# Patient Record
Sex: Male | Born: 1935 | Race: Black or African American | Hispanic: No | Marital: Married | Smoking: Former smoker
Health system: Southern US, Community
[De-identification: ages and names within clinical notes are randomized; demographics above are authoritative.]

## PROBLEM LIST (undated history)

## (undated) DIAGNOSIS — K219 Gastro-esophageal reflux disease without esophagitis: Secondary | ICD-10-CM

## (undated) DIAGNOSIS — E785 Hyperlipidemia, unspecified: Secondary | ICD-10-CM

## (undated) DIAGNOSIS — C8310 Mantle cell lymphoma, unspecified site: Secondary | ICD-10-CM

## (undated) DIAGNOSIS — I10 Essential (primary) hypertension: Secondary | ICD-10-CM

## (undated) HISTORY — PX: TONSILLECTOMY: SUR1361

## (undated) HISTORY — PX: CARPAL TUNNEL RELEASE: SHX101

---

## 2010-05-15 ENCOUNTER — Emergency Department (HOSPITAL_BASED_OUTPATIENT_CLINIC_OR_DEPARTMENT_OTHER)
Admission: EM | Admit: 2010-05-15 | Discharge: 2010-05-15 | Payer: Self-pay | Source: Home / Self Care | Admitting: Emergency Medicine

## 2010-05-15 ENCOUNTER — Ambulatory Visit: Payer: Self-pay | Admitting: Diagnostic Radiology

## 2010-09-06 LAB — POCT CARDIAC MARKERS
CKMB, poc: 1.6 ng/mL (ref 1.0–8.0)
Myoglobin, poc: 63.3 ng/mL (ref 12–200)

## 2010-09-06 LAB — URINALYSIS, ROUTINE W REFLEX MICROSCOPIC
Bilirubin Urine: NEGATIVE
Glucose, UA: NEGATIVE mg/dL
Hgb urine dipstick: NEGATIVE
Protein, ur: NEGATIVE mg/dL
Urobilinogen, UA: 1 mg/dL (ref 0.0–1.0)

## 2010-09-06 LAB — CBC
Hemoglobin: 11.4 g/dL — ABNORMAL LOW (ref 13.0–17.0)
MCH: 24.2 pg — ABNORMAL LOW (ref 26.0–34.0)
Platelets: 153 10*3/uL (ref 150–400)
RBC: 4.7 MIL/uL (ref 4.22–5.81)
WBC: 9.1 10*3/uL (ref 4.0–10.5)

## 2010-09-06 LAB — DIFFERENTIAL
Eosinophils Absolute: 0.3 10*3/uL (ref 0.0–0.7)
Lymphocytes Relative: 20 % (ref 12–46)
Lymphs Abs: 1.8 10*3/uL (ref 0.7–4.0)
Monocytes Relative: 8 % (ref 3–12)
Neutro Abs: 6.3 10*3/uL (ref 1.7–7.7)
Neutrophils Relative %: 69 % (ref 43–77)

## 2010-09-06 LAB — BASIC METABOLIC PANEL
CO2: 25 mEq/L (ref 19–32)
Calcium: 9.2 mg/dL (ref 8.4–10.5)
Creatinine, Ser: 1.1 mg/dL (ref 0.4–1.5)
GFR calc Af Amer: >60 mL/min (ref >60)
GFR calc non Af Amer: >60 mL/min (ref >60)
Sodium: 142 mEq/L (ref 135–145)

## 2010-09-06 LAB — PROTIME-INR
INR: 0.97 (ref 0.00–1.49)
Prothrombin Time: 13.1 seconds (ref 11.6–15.2)

## 2010-09-06 LAB — APTT: aPTT: 30 seconds (ref 24–37)

## 2015-04-27 ENCOUNTER — Inpatient Hospital Stay (HOSPITAL_COMMUNITY): Payer: Medicare Other

## 2015-04-27 ENCOUNTER — Emergency Department (HOSPITAL_BASED_OUTPATIENT_CLINIC_OR_DEPARTMENT_OTHER): Payer: Medicare Other

## 2015-04-27 ENCOUNTER — Inpatient Hospital Stay (HOSPITAL_BASED_OUTPATIENT_CLINIC_OR_DEPARTMENT_OTHER)
Admission: EM | Admit: 2015-04-27 | Discharge: 2015-04-30 | DRG: 178 | Disposition: A | Discharge status: Skilled Nursing Facility | Payer: Medicare Other | Attending: Internal Medicine | Admitting: Internal Medicine

## 2015-04-27 ENCOUNTER — Encounter (HOSPITAL_BASED_OUTPATIENT_CLINIC_OR_DEPARTMENT_OTHER): Payer: Self-pay

## 2015-04-27 DIAGNOSIS — R531 Weakness: Secondary | ICD-10-CM

## 2015-04-27 DIAGNOSIS — R0602 Shortness of breath: Secondary | ICD-10-CM | POA: Diagnosis present

## 2015-04-27 DIAGNOSIS — J189 Pneumonia, unspecified organism: Secondary | ICD-10-CM | POA: Diagnosis present

## 2015-04-27 DIAGNOSIS — E440 Moderate protein-calorie malnutrition: Secondary | ICD-10-CM | POA: Diagnosis present

## 2015-04-27 DIAGNOSIS — H409 Unspecified glaucoma: Secondary | ICD-10-CM | POA: Diagnosis present

## 2015-04-27 DIAGNOSIS — E785 Hyperlipidemia, unspecified: Secondary | ICD-10-CM | POA: Diagnosis present

## 2015-04-27 DIAGNOSIS — Z87891 Personal history of nicotine dependence: Secondary | ICD-10-CM

## 2015-04-27 DIAGNOSIS — L8990 Pressure ulcer of unspecified site, unspecified stage: Secondary | ICD-10-CM | POA: Diagnosis present

## 2015-04-27 DIAGNOSIS — H5442 Blindness, left eye, normal vision right eye: Secondary | ICD-10-CM | POA: Diagnosis present

## 2015-04-27 DIAGNOSIS — Z7982 Long term (current) use of aspirin: Secondary | ICD-10-CM

## 2015-04-27 DIAGNOSIS — J690 Pneumonitis due to inhalation of food and vomit: Principal | ICD-10-CM | POA: Diagnosis present

## 2015-04-27 DIAGNOSIS — Z823 Family history of stroke: Secondary | ICD-10-CM | POA: Diagnosis not present

## 2015-04-27 DIAGNOSIS — K219 Gastro-esophageal reflux disease without esophagitis: Secondary | ICD-10-CM | POA: Diagnosis present

## 2015-04-27 DIAGNOSIS — Z801 Family history of malignant neoplasm of trachea, bronchus and lung: Secondary | ICD-10-CM | POA: Diagnosis not present

## 2015-04-27 DIAGNOSIS — R05 Cough: Secondary | ICD-10-CM | POA: Diagnosis not present

## 2015-04-27 DIAGNOSIS — R197 Diarrhea, unspecified: Secondary | ICD-10-CM | POA: Diagnosis present

## 2015-04-27 DIAGNOSIS — R29898 Other symptoms and signs involving the musculoskeletal system: Secondary | ICD-10-CM | POA: Diagnosis not present

## 2015-04-27 DIAGNOSIS — Z8042 Family history of malignant neoplasm of prostate: Secondary | ICD-10-CM

## 2015-04-27 DIAGNOSIS — R4781 Slurred speech: Secondary | ICD-10-CM | POA: Diagnosis present

## 2015-04-27 DIAGNOSIS — C8310 Mantle cell lymphoma, unspecified site: Secondary | ICD-10-CM | POA: Diagnosis present

## 2015-04-27 DIAGNOSIS — I10 Essential (primary) hypertension: Secondary | ICD-10-CM | POA: Diagnosis present

## 2015-04-27 DIAGNOSIS — R059 Cough, unspecified: Secondary | ICD-10-CM | POA: Diagnosis present

## 2015-04-27 DIAGNOSIS — I6789 Other cerebrovascular disease: Secondary | ICD-10-CM | POA: Diagnosis not present

## 2015-04-27 DIAGNOSIS — C8590 Non-Hodgkin lymphoma, unspecified, unspecified site: Secondary | ICD-10-CM

## 2015-04-27 HISTORY — DX: Gastro-esophageal reflux disease without esophagitis: K21.9

## 2015-04-27 HISTORY — DX: Essential (primary) hypertension: I10

## 2015-04-27 HISTORY — DX: Hyperlipidemia, unspecified: E78.5

## 2015-04-27 HISTORY — DX: Mantle cell lymphoma, unspecified site: C83.10

## 2015-04-27 LAB — COMPREHENSIVE METABOLIC PANEL
ALT: 25 U/L (ref 17–63)
AST: 40 U/L (ref 15–41)
Albumin: 2.4 g/dL — ABNORMAL LOW (ref 3.5–5.0)
Alkaline Phosphatase: 90 U/L (ref 38–126)
Anion gap: 7 (ref 5–15)
BILIRUBIN TOTAL: 0.6 mg/dL (ref 0.3–1.2)
BUN: 19 mg/dL (ref 6–20)
CHLORIDE: 110 mmol/L (ref 101–111)
CO2: 24 mmol/L (ref 22–32)
CREATININE: 0.9 mg/dL (ref 0.61–1.24)
Calcium: 8.7 mg/dL — ABNORMAL LOW (ref 8.9–10.3)
Glucose, Bld: 103 mg/dL — ABNORMAL HIGH (ref 65–99)
POTASSIUM: 3.8 mmol/L (ref 3.5–5.1)
Sodium: 141 mmol/L (ref 135–145)
TOTAL PROTEIN: 6 g/dL — AB (ref 6.5–8.1)

## 2015-04-27 LAB — CBC
HEMATOCRIT: 33 % — AB (ref 39.0–52.0)
Hemoglobin: 10.6 g/dL — ABNORMAL LOW (ref 13.0–17.0)
MCH: 22.1 pg — AB (ref 26.0–34.0)
MCHC: 32.1 g/dL (ref 30.0–36.0)
MCV: 68.9 fL — AB (ref 78.0–100.0)
PLATELETS: 246 10*3/uL (ref 150–400)
RBC: 4.79 MIL/uL (ref 4.22–5.81)
RDW: 18.3 % — AB (ref 11.5–15.5)
WBC: 67.2 10*3/uL (ref 4.0–10.5)

## 2015-04-27 LAB — APTT: aPTT: 31 seconds (ref 24–37)

## 2015-04-27 LAB — PROTIME-INR
INR: 1.23 (ref 0.00–1.49)
PROTHROMBIN TIME: 15.7 s — AB (ref 11.6–15.2)

## 2015-04-27 LAB — TROPONIN I

## 2015-04-27 MED ORDER — AZITHROMYCIN 500 MG IV SOLR
INTRAVENOUS | Status: AC
  Filled 2015-04-27: qty 500

## 2015-04-27 MED ORDER — SODIUM CHLORIDE 0.9 % IV BOLUS (SEPSIS): 1000.0000 mL | Freq: Once | INTRAVENOUS | Status: DC

## 2015-04-27 MED ORDER — STROKE: EARLY STAGES OF RECOVERY BOOK
Freq: Once | Status: AC
  Administered 2015-04-27: 20:00:00
  Filled 2015-04-27: qty 1

## 2015-04-27 MED ORDER — HYDRALAZINE HCL 25 MG PO TABS
25.0000 mg | ORAL_TABLET | Freq: Three times a day (TID) | ORAL | Status: DC
  Administered 2015-04-27 – 2015-04-28 (×3): 25 mg via ORAL
  Filled 2015-04-27 (×4): qty 1

## 2015-04-27 MED ORDER — SENNOSIDES-DOCUSATE SODIUM 8.6-50 MG PO TABS: 1.0000 | ORAL_TABLET | Freq: Every evening | ORAL | Status: DC | PRN

## 2015-04-27 MED ORDER — HEPARIN SODIUM (PORCINE) 5000 UNIT/ML IJ SOLN
5000.0000 [IU] | Freq: Three times a day (TID) | INTRAMUSCULAR | Status: DC
  Administered 2015-04-27 – 2015-04-30 (×9): 5000 [IU] via SUBCUTANEOUS
  Filled 2015-04-27 (×8): qty 1

## 2015-04-27 MED ORDER — PANTOPRAZOLE SODIUM 40 MG PO TBEC
40.0000 mg | DELAYED_RELEASE_TABLET | Freq: Every day | ORAL | Status: DC
  Administered 2015-04-27 – 2015-04-30 (×4): 40 mg via ORAL
  Filled 2015-04-27 (×4): qty 1

## 2015-04-27 MED ORDER — ATENOLOL 50 MG PO TABS
50.0000 mg | ORAL_TABLET | Freq: Every day | ORAL | Status: DC
  Administered 2015-04-27 – 2015-04-30 (×4): 50 mg via ORAL
  Filled 2015-04-27 (×5): qty 1

## 2015-04-27 MED ORDER — PREDNISOLONE ACETATE 0.12 % OP SUSP: 1.0000 [drp] | Freq: Four times a day (QID) | OPHTHALMIC | Status: DC

## 2015-04-27 MED ORDER — EZETIMIBE 10 MG PO TABS
10.0000 mg | ORAL_TABLET | Freq: Every day | ORAL | Status: DC
  Administered 2015-04-27 – 2015-04-30 (×4): 10 mg via ORAL
  Filled 2015-04-27 (×4): qty 1

## 2015-04-27 MED ORDER — ONDANSETRON HCL 4 MG/2ML IJ SOLN: 4.0000 mg | Freq: Three times a day (TID) | INTRAMUSCULAR | Status: DC | PRN

## 2015-04-27 MED ORDER — PIPERACILLIN-TAZOBACTAM 3.375 G IVPB
3.3750 g | Freq: Three times a day (TID) | INTRAVENOUS | Status: DC
  Administered 2015-04-27 – 2015-04-30 (×9): 3.375 g via INTRAVENOUS
  Filled 2015-04-27 (×12): qty 50

## 2015-04-27 MED ORDER — SODIUM CHLORIDE 0.9 % IV SOLN
INTRAVENOUS | Status: DC
  Administered 2015-04-27: 17:00:00 via INTRAVENOUS

## 2015-04-27 MED ORDER — ALBUTEROL SULFATE (2.5 MG/3ML) 0.083% IN NEBU: 2.5000 mg | INHALATION_SOLUTION | RESPIRATORY_TRACT | Status: DC | PRN

## 2015-04-27 MED ORDER — DEXTROSE 5 % IV SOLN
1.0000 g | Freq: Once | INTRAVENOUS | Status: AC
  Administered 2015-04-27: 1 g via INTRAVENOUS

## 2015-04-27 MED ORDER — VANCOMYCIN HCL IN DEXTROSE 750-5 MG/150ML-% IV SOLN
750.0000 mg | Freq: Two times a day (BID) | INTRAVENOUS | Status: DC
  Administered 2015-04-27 – 2015-04-30 (×6): 750 mg via INTRAVENOUS
  Filled 2015-04-27 (×7): qty 150

## 2015-04-27 MED ORDER — CEFTRIAXONE SODIUM 1 G IJ SOLR
INTRAMUSCULAR | Status: AC
  Filled 2015-04-27: qty 10

## 2015-04-27 MED ORDER — ENSURE ENLIVE PO LIQD
237.0000 mL | Freq: Two times a day (BID) | ORAL | Status: DC
  Administered 2015-04-28 – 2015-04-30 (×5): 237 mL via ORAL

## 2015-04-27 MED ORDER — ATROPINE SULFATE 1 % OP SOLN
1.0000 [drp] | Freq: Three times a day (TID) | OPHTHALMIC | Status: DC
  Administered 2015-04-27 – 2015-04-28 (×2): 1 [drp] via OPHTHALMIC
  Filled 2015-04-27: qty 2

## 2015-04-27 MED ORDER — TERAZOSIN HCL 5 MG PO CAPS
10.0000 mg | ORAL_CAPSULE | Freq: Every day | ORAL | Status: DC
  Administered 2015-04-27 – 2015-04-29 (×2): 10 mg via ORAL
  Filled 2015-04-27 (×4): qty 2

## 2015-04-27 MED ORDER — DEXTROSE 5 % IV SOLN
500.0000 mg | Freq: Once | INTRAVENOUS | Status: AC
  Administered 2015-04-27: 500 mg via INTRAVENOUS

## 2015-04-27 MED ORDER — DM-GUAIFENESIN ER 30-600 MG PO TB12
1.0000 | ORAL_TABLET | Freq: Two times a day (BID) | ORAL | Status: DC
  Administered 2015-04-27 – 2015-04-30 (×6): 1 via ORAL
  Filled 2015-04-27 (×6): qty 1

## 2015-04-27 NOTE — Progress Notes (Signed)
ANTIBIOTIC CONSULT NOTE - INITIAL  Pharmacy Consult for Vancomycin and Zosyn Indication: pneumonia  No Known Allergies  Patient Measurements: Height: 5\' 11"  (180.3 cm) Weight: 160 lb (72.576 kg) IBW/kg (Calculated) : 75.3  Vital Signs: Temp: 98.2 F (36.8 C) (11/01 2045) Temp Source: Oral (11/01 2045) BP: 107/61 mmHg (11/01 2045) Pulse Rate: 77 (11/01 2045)  Labs:  Recent Labs  04/27/15 1420  WBC 67.2*  HGB 10.6*  PLT 246  CREATININE 0.90   Estimated Creatinine Clearance: 58.3 mL/min (by C-G formula based on Cr of 0.9).  Medical History: Past Medical History  Diagnosis Date  . Hypertension   . Mantle cell lymphoma ([place])   . GERD (gastroesophageal reflux disease)   . HLD (hyperlipidemia)    Assessment:   81 yr old male to begin Vanc and Zosyn for CAP vs aspiration pneumonia.  Received Azithromycin 500 mg IV and Ceftriaxone 1 gm IV at [facility] ~5pm.  Blood and sputum cultures sent. Urine to be sent for culture.  Diarrhea prior to admit, to send stool for C diff PCR.  Hx mantle cell lymphoma. WBC elevated.  Goal of Therapy:  Vancomycin trough level 15-20 mcg/ml appropriate Zosyn dose for renal function and infection  Plan:   Vancomycin 750 mg IV q12hrs.  Zosyn 3.375 gm IV q8hrs (each over 4 hrs).  Follow renal function, culture data, progress.  Vanc trough level at steady state.  Arty Baumgartner, [place]  Pager: [phone] 04/27/2015,9:12 PM

## 2015-04-27 NOTE — ED Provider Notes (Signed)
CSN: [id number]     Arrival date & time 04/27/15  1247 History   First MD Initiated Contact with Patient 04/27/15 1445     Chief Complaint  Patient presents with  . Weakness     (Consider location/radiation/quality/duration/timing/severity/associated sxs/prior Treatment) Patient is a 81 y.o. male presenting with general illness.  Illness Location:  Generalized Quality:  Weakness Severity:  Moderate Onset quality:  Gradual Duration:  2 weeks Timing:  Constant Progression:  Worsening Chronicity:  New Context:  With diarrhea Relieved by:  Nothing Worsened by:  Nothing Associated symptoms: diarrhea and fatigue   Associated symptoms: no abdominal pain, no chest pain, no cough, no fever, no nausea, no shortness of breath and no vomiting     Past Medical History  Diagnosis Date  . Hypertension   . Mantle cell lymphoma ([place])   . GERD (gastroesophageal reflux disease)   . HLD (hyperlipidemia)    Past Surgical History  Procedure Laterality Date  . Carpal tunnel release    . Tonsillectomy     Family History  Problem Relation Age of Onset  . Prostate cancer Father   . Lung cancer Brother   . Stroke Sister    Social History  Substance Use Topics  . Smoking status: Former Research scientist (life sciences)  . Smokeless tobacco: None  . Alcohol Use: Yes     Comment: occ    Review of Systems  Constitutional: Positive for fatigue. Negative for fever.  Respiratory: Negative for cough and shortness of breath.   Cardiovascular: Negative for chest pain.  Gastrointestinal: Positive for diarrhea. Negative for nausea, vomiting and abdominal pain.  All other systems reviewed and are negative.     Allergies  Review of patient's allergies indicates no known allergies.  Home Medications   Prior to Admission medications   Medication Sig Start Date End Date Taking? Authorizing Provider  aspirin EC 81 MG tablet Take 81 mg by mouth daily.   Yes Historical Provider, MD  atenolol (TENORMIN) 50 MG tablet  Take 50 mg by mouth 2 (two) times daily.    Yes Historical Provider, MD  atropine 1 % ophthalmic solution Place 1 drop into the left eye daily.    Yes Historical Provider, MD  brimonidine-timolol (COMBIGAN) 0.2-0.5 % ophthalmic solution Place 1 drop into the right eye 2 (two) times daily. 12/28/13  Yes Historical Provider, MD  CALCIUM PO Take 1 tablet by mouth daily.   Yes Historical Provider, MD  ezetimibe (ZETIA) 10 MG tablet Take 10 mg by mouth daily.   Yes Historical Provider, MD  hydrALAZINE (APRESOLINE) 25 MG tablet Take 25 mg by mouth 3 (three) times daily.   Yes Historical Provider, MD  Multiple Vitamin (MULTIVITAMIN WITH MINERALS) TABS tablet Take 1 tablet by mouth daily.   Yes Historical Provider, MD  omeprazole (PRILOSEC) 20 MG capsule Take 20 mg by mouth daily.   Yes Historical Provider, MD  prednisoLONE acetate (PRED FORTE) 1 % ophthalmic suspension Place 1 drop into the left eye daily. 04/06/15  Yes Historical Provider, MD  terazosin (HYTRIN) 10 MG capsule Take 10 mg by mouth at bedtime.   Yes Historical Provider, MD   BP 109/55 mmHg  Pulse 79  Temp(Src) 97.7 F (36.5 C) (Oral)  Resp 18  Ht 5\' 11"  (1.803 m)  Wt 160 lb (72.576 kg)  BMI 22.33 kg/m2  SpO2 94% Physical Exam  Constitutional: He is oriented to person, place, and time. He appears well-developed and well-nourished.  HENT:  Head: Normocephalic and atraumatic.  Eyes: Conjunctivae and EOM are normal.  Neck: Normal range of motion. Neck supple.  Cardiovascular: Normal rate, regular rhythm and normal heart sounds.   Pulmonary/Chest: Effort normal and breath sounds normal. No respiratory distress.  Abdominal: He exhibits no distension. There is no tenderness. There is no rebound and no guarding.  Musculoskeletal: Normal range of motion.  Neurological: He is alert and oriented to person, place, and time. He has normal strength. No cranial nerve deficit or sensory deficit. GCS eye subscore is 4. GCS verbal subscore is 5.  GCS motor subscore is 6.  Skin: Skin is warm and dry.  Vitals reviewed.   ED Course  Procedures (including critical care time) Labs Review Labs Reviewed  CBC - Abnormal; Notable for the following:    WBC 67.2 (*)    Hemoglobin 10.6 (*)    HCT 33.0 (*)    MCV 68.9 (*)    MCH 22.1 (*)    RDW 18.3 (*)    All other components within normal limits  COMPREHENSIVE METABOLIC PANEL - Abnormal; Notable for the following:    Glucose, Bld 103 (*)    Calcium 8.7 (*)    Total Protein 6.0 (*)    Albumin 2.4 (*)    All other components within normal limits  URINALYSIS, ROUTINE W REFLEX MICROSCOPIC (NOT AT [facility]) - Abnormal; Notable for the following:    Color, Urine AMBER (*)    Bilirubin Urine SMALL (*)    Protein, ur 30 (*)    All other components within normal limits  BRAIN NATRIURETIC PEPTIDE - Abnormal; Notable for the following:    B Natriuretic Peptide 243.3 (*)    All other components within normal limits  HEMOGLOBIN A1C - Abnormal; Notable for the following:    Hgb A1c MFr Bld 6.0 (*)    All other components within normal limits  LIPID PANEL - Abnormal; Notable for the following:    HDL 29 (*)    All other components within normal limits  PROTIME-INR - Abnormal; Notable for the following:    Prothrombin Time 15.7 (*)    All other components within normal limits  URINE MICROSCOPIC-ADD ON - Abnormal; Notable for the following:    Crystals CA OXALATE CRYSTALS (*)    All other components within normal limits  CBC - Abnormal; Notable for the following:    Hemoglobin 10.1 (*)    HCT 32.0 (*)    MCV 71.6 (*)    MCH 22.6 (*)    RDW 18.5 (*)    All other components within normal limits  BASIC METABOLIC PANEL - Abnormal; Notable for the following:    Potassium 3.2 (*)    BUN 21 (*)    Calcium 8.3 (*)    GFR calc non Af Amer 54 (*)    All other components within normal limits  URINE CULTURE  CULTURE, BLOOD (ROUTINE X 2)  CULTURE, BLOOD (ROUTINE X 2)  C DIFFICILE QUICK SCREEN W  PCR REFLEX  CULTURE, EXPECTORATED SPUTUM-ASSESSMENT  STOOL CULTURE  CULTURE, EXPECTORATED SPUTUM-ASSESSMENT  TROPONIN I  INFLUENZA PANEL BY PCR (TYPE A & B, H1N1)  APTT  STREP PNEUMONIAE URINARY ANTIGEN  LEGIONELLA PNEUMOPHILA SEROGP 1 UR AG    Imaging Review Dg Chest 2 View  04/27/2015  CLINICAL DATA:  Cough, bilateral lower extremity edema, weakness. Symptoms for a few days. EXAM: CHEST  2 VIEW COMPARISON:  10/13/2014 FINDINGS: Small to moderate right pleural effusion with right lower atelectasis or infiltrate. No confluent opacity  on the left. Heart is normal size. No acute bony abnormality. IMPRESSION: Small to moderate right pleural effusion with right lower lobe atelectasis or infiltrate. Electronically Signed   By: Rolm Baptise M.D.   On: 04/27/2015 15:32   Ct Head Wo Contrast  04/27/2015  CLINICAL DATA:  Left-sided weakness today. EXAM: CT HEAD WITHOUT CONTRAST TECHNIQUE: Contiguous axial images were obtained from the base of the skull through the vertex without intravenous contrast. COMPARISON:  10/13/2014 FINDINGS: Mild cerebral atrophy. Patchy low-attenuation changes in the deep white matter consistent with small vessel ischemia. No ventricular dilatation. No mass effect or midline shift. No abnormal extra-axial fluid collections. Gray-white matter junctions are distinct. Basal cisterns are not effaced. No evidence of acute intracranial hemorrhage. No depressed skull fractures. Visualized paranasal sinuses and mastoid air cells are not opacified. Postoperative changes in the orbits. Vascular calcifications. IMPRESSION: No acute intracranial abnormalities. Mild chronic atrophy and small vessel ischemic changes. Electronically Signed   By: Lucienne Capers M.D.   On: 04/27/2015 21:54   Ct Chest W Contrast  04/28/2015  CLINICAL DATA:  81 year old male inpatient with a reported history of mantle cell lymphoma. Restaging. EXAM: CT CHEST, ABDOMEN, AND PELVIS WITH CONTRAST TECHNIQUE:  Multidetector CT imaging of the chest, abdomen and pelvis was performed following the standard protocol during bolus administration of intravenous contrast. CONTRAST:  160mL OMNIPAQUE IOHEXOL 300 MG/ML  SOLN COMPARISON:  05/29/2014 PET-CT.  09/27/2011 CT abdomen/pelvis. FINDINGS: CT CHEST FINDINGS Mediastinum/Nodes: Normal heart size. No pericardial fluid/thickening. There is atherosclerosis of the thoracic aorta, the great vessels of the mediastinum and the coronary arteries, including calcified atherosclerotic plaque in the left anterior descending, left circumflex and right coronary arteries. Great vessels are normal in course and caliber. No central pulmonary emboli. Normal visualized thyroid. Mildly patulous and otherwise grossly normal esophagus. There is moderate bilateral axillary lymphadenopathy, with a dominant 2.0 cm enlarged right axillary node (series 201/ image 22), increased from 1.1 cm on 05/29/2014. An enlarged 1.7 cm left axillary node (201/ image 21) is increased from 0.9 cm. Anterior mediastinal 1.1 cm mass (201/20) is increased from 0.8 cm, either thymic hyperplasia or anterior mediastinal lymphadenopathy. Moderate mediastinal lymphadenopathy in the paratracheal and subcarinal territories has increased. For example an enlarged 1.6 cm right paratracheal node (201/15) is increased from 0.6 cm. A 1.9 cm subcarinal node (201/31) is increased from 0.7 cm. Mild-to-moderate bilateral hilar lymphadenopathy is increased bilaterally. Coarsely calcified nodes from prior granulomatous disease are again noted in the left hilum. Lungs/Pleura: No pneumothorax. New moderate right pleural effusion with mild posterior right pleural thickening up to 7 mm thickness. No left pleural effusion. There is new peribronchovascular interstitial thickening and nodularity in the right greater than left lungs. A subpleural 1.0 cm anterior left lower lobe pulmonary nodule (203/35) is increased from 0.6 cm. There is moderate  passive atelectasis in the dependent right lung. Musculoskeletal: No aggressive appearing focal osseous lesions. Severe right glenohumeral joint osteoarthritis. Moderate degenerative changes in the thoracic spine. There is anasarca. CT ABDOMEN PELVIS FINDINGS Hepatobiliary: Normal liver with no liver mass. Normal gallbladder with no radiopaque cholelithiasis. No biliary ductal dilatation. Pancreas: Normal, with no mass or duct dilation. Spleen: Normal size. No mass. Adrenals/Urinary Tract: Normal adrenals. Three subcentimeter hypodense lesions in the right kidney are too small to characterize. No hydronephrosis. Normal bladder. Stomach/Bowel: Grossly normal stomach. Normal caliber small bowel with no small bowel wall thickening. Appendix is not discretely visualized. There is diffuse wall thickening of the entire colon and rectum.  Vascular/Lymphatic: Atherosclerotic nonaneurysmal abdominal aorta. Patent portal, splenic, hepatic and renal veins. There is new bulky mesenteric lymphadenopathy, for example a 3.4 cm central mesenteric node (201/79) . There is new bilateral mild inguinal lymphadenopathy. Reproductive: Stable moderate prostatomegaly. Other: No pneumoperitoneum, ascites or focal fluid collection. Musculoskeletal: No aggressive appearing focal osseous lesions. Stable Paget's disease of the right proximal femur. Severe degenerative changes in the lumbar spine. Anasarca. IMPRESSION: 1. Interval progression of bilateral axillary, mediastinal, bilateral hilar, mesenteric and bilateral inguinal lymphadenopathy, in keeping with progressive lymphoma. 2. New peribronchovascular interstitial thickening and nodularity involving the right greater than left lungs. Enlarging left lower lobe subpleural pulmonary nodule. 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 3. New moderate right pleural effusion with mild pleural thickening, suggesting a malignant effusion. 4. Anasarca. Diffuse colorectal wall  thickening, likely due to non inflammatory edema such as due to hypoalbuminemia given the anasarca. 5. Atherosclerosis, including three-vessel coronary artery disease. Please note that although the presence of coronary artery calcium documents the presence of coronary artery disease, the severity of this disease and any potential stenosis cannot be assessed on this non-gated CT examination. Electronically Signed   By: Ilona Sorrel M.D.   On: 04/28/2015 18:56   Mr Brain Wo Contrast  04/27/2015  CLINICAL DATA:  Generalized weakness, diarrhea, slurred speech, RIGHT leg weakness and bilateral leg edema beginning 5 days ago. History of lymphoma, hypertension, hyperlipidemia, LEFT eye blindness. EXAM: MRI HEAD WITHOUT CONTRAST MRA HEAD WITHOUT CONTRAST TECHNIQUE: Multiplanar, multiecho pulse sequences of the brain and surrounding structures were obtained without intravenous contrast. Angiographic images of the head were obtained using MRA technique without contrast. COMPARISON:  CT head April 27, 2015 at 2132 hours FINDINGS: MRI HEAD FINDINGS Punctate focus of reduced diffusion LEFT pons on axial 13/94 not corroborated on coronal T2 view likely artifact. Ventricles and sulci are normal for patient's age. No susceptibility artifact to suggest blood products. Patchy to confluent supratentorial white matter FLAIR T2 hyperintense signal without midline shift, mass effect or mass lesions. Old small RIGHT basal ganglia lacunar infarcts. No abnormal extra-axial fluid collections. Status post bilateral ocular lens implants. Old LEFT medial orbital blowout fracture. Imaged paranasal sinuses are well aerated. Small RIGHT mastoid effusion. Elongated sella in AP dimension, and narrowed in transaxial dimension. No cerebellar tonsillar ectopia. No suspicious calvarial bone marrow signal. MRA HEAD FINDINGS Anterior Circulation: Dolicoectatic internal carotid arteries. Mild luminal irregularity of the LEFT carotid siphon  corresponding to dense calcifications on the prior CT head. Bilateral internal carotid arteries are widely patent. Normal flow related enhancement bilateral anterior cerebral arteries, tiny anterior communicating artery present. Normal flow related enhancement of bilateral middle cerebral arteries, including more distal segments. Posterior circulation: LEFT vertebral artery is dominant. Widely patent bilateral vertebral arteries, patent basilar artery though motion somewhat limits evaluation. Robust bilateral posterior communicating arteries contribute to the posterior circulation. Bilateral posterior cerebral arteries are widely patent. IMPRESSION: MRI HEAD: No convincing evidence of acute ischemia nor acute intracranial process. Involutional changes. Moderate chronic small vessel ischemic disease. Old RIGHT basal ganglia lacunar infarcts. MRA HEAD: No acute large vessel occlusion or high-grade stenosis. Dolichoectasia compatible chronic hypertension. Electronically Signed   By: Elon Alas M.D.   On: 04/27/2015 22:52   Ct Abdomen Pelvis W Contrast  04/28/2015  CLINICAL DATA:  81 year old male inpatient with a reported history of mantle cell lymphoma. Restaging. EXAM: CT CHEST, ABDOMEN, AND PELVIS WITH CONTRAST TECHNIQUE: Multidetector CT imaging of the chest, abdomen and pelvis was performed following the standard  protocol during bolus administration of intravenous contrast. CONTRAST:  193mL OMNIPAQUE IOHEXOL 300 MG/ML  SOLN COMPARISON:  05/29/2014 PET-CT.  09/27/2011 CT abdomen/pelvis. FINDINGS: CT CHEST FINDINGS Mediastinum/Nodes: Normal heart size. No pericardial fluid/thickening. There is atherosclerosis of the thoracic aorta, the great vessels of the mediastinum and the coronary arteries, including calcified atherosclerotic plaque in the left anterior descending, left circumflex and right coronary arteries. Great vessels are normal in course and caliber. No central pulmonary emboli. Normal visualized  thyroid. Mildly patulous and otherwise grossly normal esophagus. There is moderate bilateral axillary lymphadenopathy, with a dominant 2.0 cm enlarged right axillary node (series 201/ image 22), increased from 1.1 cm on 05/29/2014. An enlarged 1.7 cm left axillary node (201/ image 21) is increased from 0.9 cm. Anterior mediastinal 1.1 cm mass (201/20) is increased from 0.8 cm, either thymic hyperplasia or anterior mediastinal lymphadenopathy. Moderate mediastinal lymphadenopathy in the paratracheal and subcarinal territories has increased. For example an enlarged 1.6 cm right paratracheal node (201/15) is increased from 0.6 cm. A 1.9 cm subcarinal node (201/31) is increased from 0.7 cm. Mild-to-moderate bilateral hilar lymphadenopathy is increased bilaterally. Coarsely calcified nodes from prior granulomatous disease are again noted in the left hilum. Lungs/Pleura: No pneumothorax. New moderate right pleural effusion with mild posterior right pleural thickening up to 7 mm thickness. No left pleural effusion. There is new peribronchovascular interstitial thickening and nodularity in the right greater than left lungs. A subpleural 1.0 cm anterior left lower lobe pulmonary nodule (203/35) is increased from 0.6 cm. There is moderate passive atelectasis in the dependent right lung. Musculoskeletal: No aggressive appearing focal osseous lesions. Severe right glenohumeral joint osteoarthritis. Moderate degenerative changes in the thoracic spine. There is anasarca. CT ABDOMEN PELVIS FINDINGS Hepatobiliary: Normal liver with no liver mass. Normal gallbladder with no radiopaque cholelithiasis. No biliary ductal dilatation. Pancreas: Normal, with no mass or duct dilation. Spleen: Normal size. No mass. Adrenals/Urinary Tract: Normal adrenals. Three subcentimeter hypodense lesions in the right kidney are too small to characterize. No hydronephrosis. Normal bladder. Stomach/Bowel: Grossly normal stomach. Normal caliber small  bowel with no small bowel wall thickening. Appendix is not discretely visualized. There is diffuse wall thickening of the entire colon and rectum. Vascular/Lymphatic: Atherosclerotic nonaneurysmal abdominal aorta. Patent portal, splenic, hepatic and renal veins. There is new bulky mesenteric lymphadenopathy, for example a 3.4 cm central mesenteric node (201/79) . There is new bilateral mild inguinal lymphadenopathy. Reproductive: Stable moderate prostatomegaly. Other: No pneumoperitoneum, ascites or focal fluid collection. Musculoskeletal: No aggressive appearing focal osseous lesions. Stable Paget's disease of the right proximal femur. Severe degenerative changes in the lumbar spine. Anasarca. IMPRESSION: 1. Interval progression of bilateral axillary, mediastinal, bilateral hilar, mesenteric and bilateral inguinal lymphadenopathy, in keeping with progressive lymphoma. 2. New peribronchovascular interstitial thickening and nodularity involving the right greater than left lungs. Enlarging left lower lobe subpleural pulmonary nodule. 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 3. New moderate right pleural effusion with mild pleural thickening, suggesting a malignant effusion. 4. Anasarca. Diffuse colorectal wall thickening, likely due to non inflammatory edema such as due to hypoalbuminemia given the anasarca. 5. Atherosclerosis, including three-vessel coronary artery disease. Please note that although the presence of coronary artery calcium documents the presence of coronary artery disease, the severity of this disease and any potential stenosis cannot be assessed on this non-gated CT examination. Electronically Signed   By: Ilona Sorrel M.D.   On: 04/28/2015 18:56   Mr Jodene Nam Head/brain Wo Cm  04/27/2015  CLINICAL DATA:  Generalized weakness,  diarrhea, slurred speech, RIGHT leg weakness and bilateral leg edema beginning 5 days ago. History of lymphoma, hypertension, hyperlipidemia, LEFT eye  blindness. EXAM: MRI HEAD WITHOUT CONTRAST MRA HEAD WITHOUT CONTRAST TECHNIQUE: Multiplanar, multiecho pulse sequences of the brain and surrounding structures were obtained without intravenous contrast. Angiographic images of the head were obtained using MRA technique without contrast. COMPARISON:  CT head April 27, 2015 at 2132 hours FINDINGS: MRI HEAD FINDINGS Punctate focus of reduced diffusion LEFT pons on axial 13/94 not corroborated on coronal T2 view likely artifact. Ventricles and sulci are normal for patient's age. No susceptibility artifact to suggest blood products. Patchy to confluent supratentorial white matter FLAIR T2 hyperintense signal without midline shift, mass effect or mass lesions. Old small RIGHT basal ganglia lacunar infarcts. No abnormal extra-axial fluid collections. Status post bilateral ocular lens implants. Old LEFT medial orbital blowout fracture. Imaged paranasal sinuses are well aerated. Small RIGHT mastoid effusion. Elongated sella in AP dimension, and narrowed in transaxial dimension. No cerebellar tonsillar ectopia. No suspicious calvarial bone marrow signal. MRA HEAD FINDINGS Anterior Circulation: Dolicoectatic internal carotid arteries. Mild luminal irregularity of the LEFT carotid siphon corresponding to dense calcifications on the prior CT head. Bilateral internal carotid arteries are widely patent. Normal flow related enhancement bilateral anterior cerebral arteries, tiny anterior communicating artery present. Normal flow related enhancement of bilateral middle cerebral arteries, including more distal segments. Posterior circulation: LEFT vertebral artery is dominant. Widely patent bilateral vertebral arteries, patent basilar artery though motion somewhat limits evaluation. Robust bilateral posterior communicating arteries contribute to the posterior circulation. Bilateral posterior cerebral arteries are widely patent. IMPRESSION: MRI HEAD: No convincing evidence of acute  ischemia nor acute intracranial process. Involutional changes. Moderate chronic small vessel ischemic disease. Old RIGHT basal ganglia lacunar infarcts. MRA HEAD: No acute large vessel occlusion or high-grade stenosis. Dolichoectasia compatible chronic hypertension. Electronically Signed   By: Elon Alas M.D.   On: 04/27/2015 22:52   I have personally reviewed and evaluated these images and lab results as part of my medical decision-making.   EKG Interpretation   Date/Time:  Tuesday April 27 2015 14:27:21 EDT Ventricular Rate:  74 PR Interval:  172 QRS Duration: 84 QT Interval:  376 QTC Calculation: 417 R Axis:   -63 Text Interpretation:  Sinus rhythm with Premature supraventricular  complexes Left axis deviation Low voltage QRS Cannot rule out Anterior  infarct , age undetermined Abnormal ECG agree. no sig change from  previous. Confirmed by Johnney Killian, MD, Jeannie Done [phone]) on 04/27/2015 3:52:53 PM      MDM   Final diagnoses:  Community acquired pneumonia    81 y.o. male with pertinent PMH of HTN, mantle cell lymphoma presents with generalized weakness and diarrhea x 2 weeks.  Physical exam benign, pt without pain.  Wu as above, significant for PNA vs effusion.  Admitted in stable condition  I have reviewed all laboratory and imaging studies if ordered as above  1. Community acquired pneumonia   2. Left leg weakness   3. Lymphoma ([place])   4. Lymphoma ([place])         Debby Freiberg, MD 04/29/15 [phone]

## 2015-04-27 NOTE — ED Notes (Signed)
MD at bedside. 

## 2015-04-27 NOTE — ED Notes (Addendum)
Son brought pt in with c/o weakness x 3-4 days, swelling to bilat LEs-slurred speech when he went to check on pt at 12pm-pt is A/O to place and name-reoriented to date-son states speech appears baseline-pt states he has had diarrhea x 3 days

## 2015-04-27 NOTE — Progress Notes (Signed)
Patient trasfered from [facility] via [facility]; alert and oriented x 4; no complaints of pain; IV in L[facility] receiving NS@75cc /hr. Orient patient to room and unit; gave patient care guide; instructed how to use the call bell and  fall risk precautions. Will continue to monitor the patient.

## 2015-04-27 NOTE — ED Notes (Signed)
Patient transported to X-ray 

## 2015-04-27 NOTE — H&P (Signed)
[facility] History and Physical  Alexander Beck ION:[id number] DOB: 1924/08/11 DOA: 04/27/2015  Referring physician: ED physician PCP: No primary care provider on file.  Specialists:   Chief Complaint: Generalized weakness, diarrhea, slurred speech, R leg weakness, bilateral leg edema.  HPI: Alexander Beck is a 81 y.o. male with PMH of mantle cell lymphoma, hypertension, hyperlipidemia, GERD, left eye glaucoma (nearlly blinded), who presents with generalized weakness, diarrhea, slurred speech, R leg weakness, bilateral leg edema.  Patient reports that he has been having diarrhea in the past 2 weeks. He has 2-4 bowel movements each day with watery stool. No nausea, vomiting. He has mild abdominal discomfort. With decreased oral intake and generalized weakness.  He reports having cough and shortness of breath in the past 2 weeks. He coughs up white sputum, sometimes brown colored sputum. He does not have chest pain, fever or chills.  Patient reports that he has right leg weakness since Friday. He was noticed to have slurred speech. He denies any tingling sensations in his extremities. No hearing loss. He has near blindness in the left eye due to glaucoma, whch has not changed. No vision change in right eye.   Patient also reports having bilateral lower leg edema which has been going on for more than 3 months. No chest pain or palpitation. Patient denies symptoms of UTI, rashes, bloody stool, hematemesis or hematuria.   In ED, patient was found to have WBC 67.2, hemoglobin 9.1 on 05/15/10-->10.6, temperature normal, no tachycardia, electrolytes okay. Chest x-ray showed right-sided small to moderate pleural effusion, possible right lower lobe pneumonia.   Where does patient live?   At home    Can patient participate in ADLs? little  Review of Systems:   General: no fevers, chills, no changes in body weight, has poor appetite, has fatigue HEENT: no new blurry vision, hearing changes or  sore throat Pulm: has dyspnea, coughing, no wheezing CV: no chest pain, palpitations Abd: no nausea, vomiting, abdominal pain, has diarrhea, no constipation GU: no dysuria, burning on urination, increased urinary frequency, hematuria  Ext: has bilateral leg edema Neuro: has right leg weakness and slurred speech, no numbness, or tingling, no  New vision change or hearing loss Skin: no rash MSK: No muscle spasm, no deformity, no limitation of range of movement in spin Heme: No easy bruising.  Travel history: No recent long distant travel.  Allergy: No Known Allergies  Past Medical History  Diagnosis Date  . Hypertension   . Mantle cell lymphoma ([place])   . GERD (gastroesophageal reflux disease)   . HLD (hyperlipidemia)     Past Surgical History  Procedure Laterality Date  . Carpal tunnel release    . Tonsillectomy      Social History:  reports that he has quit smoking. He does not have any smokeless tobacco history on file. He reports that he drinks alcohol. He reports that he does not use illicit drugs.  Family History:  Family History  Problem Relation Age of Onset  . Prostate cancer Father   . Lung cancer Brother   . Stroke Sister      Prior to Admission medications   Medication Sig Start Date End Date Taking? Authorizing Provider  atenolol (TENORMIN) 50 MG tablet Take 50 mg by mouth daily.   Yes Historical Provider, MD  atropine 1 % ophthalmic solution 3 (three) times daily.   Yes Historical Provider, MD  ezetimibe (ZETIA) 10 MG tablet Take 10 mg by mouth daily.   Yes Historical Provider,  MD  hydrALAZINE (APRESOLINE) 25 MG tablet Take 25 mg by mouth 3 (three) times daily.   Yes Historical Provider, MD  omeprazole (PRILOSEC) 20 MG capsule Take 20 mg by mouth daily.   Yes Historical Provider, MD  prednisoLONE acetate (PRED MILD) 0.12 % ophthalmic suspension 1 drop 4 (four) times daily.   Yes Historical Provider, MD  terazosin (HYTRIN) 10 MG capsule Take 10 mg by mouth at  bedtime.   Yes Historical Provider, MD    Physical Exam: Filed Vitals:   04/27/15 1500 04/27/15 1545 04/27/15 1630 04/27/15 1700  BP: 114/65  105/63 115/74  Pulse: 85 88  78  Temp:      TempSrc:      Resp: 23 21 23 18   Height:      Weight:      SpO2: 92% 95%  94%   General: Not in acute distress HEENT:       Eyes: left eye nearlly blinded. R eye with PERRL, EOMI, no scleral icterus.       ENT: No discharge from the ears and nose, no pharynx injection, no tonsillar enlargement.        Neck: No JVD, no bruit, no mass felt. Heme: No neck lymph node enlargement. Cardiac: S1/S2, RRR, No murmurs, No gallops or rubs. Pulm: has fine crackles bilaterally. No wheezing, rhonchi or rubs. Abd: Soft, nondistended, nontender, no rebound pain, no organomegaly, BS present. Ext: 2+ pitting leg edema bilaterally. 2+DP/PT pulse bilaterally. Musculoskeletal: No joint deformities, No joint redness or warmth, no limitation of ROM in spin. Skin: No rashes.  Neuro: Alert, oriented X3, cranial nerves II-XII grossly intact, muscle strength 3/5 in R leg and 5/5 in other extremeties, sensation to light touch intact. Brachial reflex 1+ bilaterally. Knee reflex 1+ bilaterally. Negative Babinski's sign. Normal finger to nose test. Psych: Patient is not psychotic, no suicidal or hemocidal ideation.  Labs on Admission:  Basic Metabolic Panel:  Recent Labs Lab 04/27/15 1420  NA 141  K 3.8  CL 110  CO2 24  GLUCOSE 103*  BUN 19  CREATININE 0.90  CALCIUM 8.7*   Liver Function Tests:  Recent Labs Lab 04/27/15 1420  AST 40  ALT 25  ALKPHOS 90  BILITOT 0.6  PROT 6.0*  ALBUMIN 2.4*   No results for input(s): LIPASE, AMYLASE in the last 168 hours. No results for input(s): AMMONIA in the last 168 hours. CBC:  Recent Labs Lab 04/27/15 1420  WBC 67.2*  HGB 10.6*  HCT 33.0*  MCV 68.9*  PLT 246   Cardiac Enzymes:  Recent Labs Lab 04/27/15 1420  TROPONINI <0.03    BNP (last 3  results) No results for input(s): BNP in the last 8760 hours.  ProBNP (last 3 results) No results for input(s): PROBNP in the last 8760 hours.  CBG: No results for input(s): GLUCAP in the last 168 hours.  Radiological Exams on Admission: Dg Chest 2 View  04/27/2015  CLINICAL DATA:  Cough, bilateral lower extremity edema, weakness. Symptoms for a few days. EXAM: CHEST  2 VIEW COMPARISON:  10/13/2014 FINDINGS: Small to moderate right pleural effusion with right lower atelectasis or infiltrate. No confluent opacity on the left. Heart is normal size. No acute bony abnormality. IMPRESSION: Small to moderate right pleural effusion with right lower lobe atelectasis or infiltrate. Electronically Signed   By: Rolm Baptise M.D.   On: 04/27/2015 15:32    EKG: Independently reviewed. LAD, PAC, poor R-wave progression  Assessment/Plan Principal Problem:   Left  leg weakness Active Problems:   Community acquired pneumonia   Hypertension   Mantle cell lymphoma ([place])   HLD (hyperlipidemia)   Diarrhea   Generalized weakness   leg swelling   Cough   SOB (shortness of breath)   Aspiration pneumonia (HCC)   Protein-calorie malnutrition, moderate (HCC)  Left leg weakness and slurred speech: It is concerning for stroke. His risk factors include old age, hypertension, hyperlipidemia, and family history of stroke. His symptoms have been going on since Friday, obviously out of window for tPA even proved to have stroke.  - will admit to tele bed - Risk factor modification: HgbA1c, fasting lipid panel  - MRI, MRA of the brain without contrast  - PT consult, OT consult, Speech consult  - 2 d Echocardiogram  - Ekg  - Carotid dopplers  - will start Aspirin if CT-head is negative for hemorrhagic stroke  Productive cough and shortness of breath: Chest x-ray showed possible infiltration in the right lower lobe. This is likely due to aspiration pneumonia versus CAP. Given his possible stroke, more likely to  have aspiration pneumonia. Patient is not septic on admission.  -ED started the patient with Rocephin and azithromycin, will switch to vancomycin and Zosyn given possible aspiration pneumonia and he immunocompromised status secondary from Mantle cell lymphoma. - Mucinex for cough  - Albuterol Neb prn for SOB - Urine legionella and S. pneumococcal antigen - Follow up blood culture x2, sputum culture, plus Flu pcr  HTN: -Continue atenolol, hydralazine -also on Tetrazosin  HLD: Last LDL was not on record -Continue home medications: Zetia -f/u FLP  Diarrhea: No recent antibiotic use. Given her immunocompromised status, likely to have opportunistic infection. -check c diff pcr -stool culture -Hold IV fluid due to leg edema -zofran prn if develops nausea or vomiting.  Protein-calorie malnutrition, moderate (HCC) - Ensure when able to eat  Bilateral leg swelling: Etiology is not clear. Renal function and liver function are okay. Potential differential diagnosis include hypoalbuminemia secondary to protein calorie more nutrition versus CHF. -will f/u 2d echo -check BNP -on atenolol  Mantle cell lymphoma ([place]): diagnosed 2 to 3 month ago. Has been followed up in [place] (pt could not remember his doctor's name). No specific treatment was given, currently under watching. No bleeding tendency. WBC 67.2, hemoglobin 10.6, platelet 246. -f/u by CBC -f/u with hematology  Generalized weakness: Likely multifactorial, including deconditioning, diarrhea, possible stroke, pneumonia, protein calorie malnutrition -Nutrition supplement -Treat underlying issues as above -PT/OT  DVT ppx: SQ Heparin     Code Status: Full code Family Communication: None at bed side Disposition Plan: Admit to inpatient   Date of Service 04/27/2015    Ivor Costa [facility] Pager [phone]  If 7PM-7AM, please contact night-coverage [email or web address] Password [facility] 04/27/2015, 8:28 PM

## 2015-04-28 ENCOUNTER — Inpatient Hospital Stay (HOSPITAL_COMMUNITY): Payer: Medicare Other

## 2015-04-28 ENCOUNTER — Encounter (HOSPITAL_COMMUNITY): Payer: Self-pay | Admitting: *Deleted

## 2015-04-28 DIAGNOSIS — R29898 Other symptoms and signs involving the musculoskeletal system: Secondary | ICD-10-CM

## 2015-04-28 DIAGNOSIS — L8990 Pressure ulcer of unspecified site, unspecified stage: Secondary | ICD-10-CM | POA: Insufficient documentation

## 2015-04-28 DIAGNOSIS — E440 Moderate protein-calorie malnutrition: Secondary | ICD-10-CM

## 2015-04-28 DIAGNOSIS — R531 Weakness: Secondary | ICD-10-CM

## 2015-04-28 DIAGNOSIS — R197 Diarrhea, unspecified: Secondary | ICD-10-CM

## 2015-04-28 DIAGNOSIS — J690 Pneumonitis due to inhalation of food and vomit: Principal | ICD-10-CM

## 2015-04-28 DIAGNOSIS — C8310 Mantle cell lymphoma, unspecified site: Secondary | ICD-10-CM

## 2015-04-28 DIAGNOSIS — I6789 Other cerebrovascular disease: Secondary | ICD-10-CM

## 2015-04-28 DIAGNOSIS — R4781 Slurred speech: Secondary | ICD-10-CM

## 2015-04-28 DIAGNOSIS — E785 Hyperlipidemia, unspecified: Secondary | ICD-10-CM

## 2015-04-28 LAB — LIPID PANEL
Cholesterol: 123 mg/dL (ref 0–200)
HDL: 29 mg/dL — AB (ref >40)
LDL CALC: 73 mg/dL (ref 0–99)
Total CHOL/HDL Ratio: 4.2 RATIO
Triglycerides: 104 mg/dL (ref <150)
VLDL: 21 mg/dL (ref 0–40)

## 2015-04-28 LAB — C DIFFICILE QUICK SCREEN W PCR REFLEX
C DIFFICLE (CDIFF) ANTIGEN: NEGATIVE
C Diff interpretation: NEGATIVE
C Diff toxin: NEGATIVE

## 2015-04-28 LAB — URINALYSIS, ROUTINE W REFLEX MICROSCOPIC
Glucose, UA: NEGATIVE mg/dL
Hgb urine dipstick: NEGATIVE
KETONES UR: NEGATIVE mg/dL
LEUKOCYTES UA: NEGATIVE
NITRITE: NEGATIVE
PH: 6 (ref 5.0–8.0)
PROTEIN: 30 mg/dL — AB
Specific Gravity, Urine: 1.029 (ref 1.005–1.030)
UROBILINOGEN UA: 0.2 mg/dL (ref 0.0–1.0)

## 2015-04-28 LAB — EXPECTORATED SPUTUM ASSESSMENT W REFEX TO RESP CULTURE

## 2015-04-28 LAB — EXPECTORATED SPUTUM ASSESSMENT W GRAM STAIN, RFLX TO RESP C

## 2015-04-28 LAB — URINE MICROSCOPIC-ADD ON

## 2015-04-28 LAB — STREP PNEUMONIAE URINARY ANTIGEN: STREP PNEUMO URINARY ANTIGEN: NEGATIVE

## 2015-04-28 LAB — INFLUENZA PANEL BY PCR (TYPE A & B)
H1N1FLUPCR: NOT DETECTED
INFLAPCR: NEGATIVE
INFLBPCR: NEGATIVE

## 2015-04-28 LAB — BRAIN NATRIURETIC PEPTIDE: B NATRIURETIC PEPTIDE 5: 243.3 pg/mL — AB (ref 0.0–100.0)

## 2015-04-28 MED ORDER — IOHEXOL 300 MG/ML  SOLN
100.0000 mL | Freq: Once | INTRAMUSCULAR | Status: AC | PRN
  Administered 2015-04-28: 100 mL via INTRAVENOUS

## 2015-04-28 MED ORDER — ASPIRIN 325 MG PO TABS
325.0000 mg | ORAL_TABLET | Freq: Every day | ORAL | Status: DC
Start: 2015-04-28 — End: 2015-04-30
  Administered 2015-04-28 – 2015-04-30 (×3): 325 mg via ORAL
  Filled 2015-04-28 (×4): qty 1

## 2015-04-28 MED ORDER — IOHEXOL 300 MG/ML  SOLN
25.0000 mL | INTRAMUSCULAR | Status: AC
  Administered 2015-04-28: 25 mL via ORAL

## 2015-04-28 MED ORDER — ATROPINE SULFATE 1 % OP SOLN
1.0000 [drp] | Freq: Every day | OPHTHALMIC | Status: DC
  Administered 2015-04-29 – 2015-04-30 (×2): 1 [drp] via OPHTHALMIC
  Filled 2015-04-28: qty 2

## 2015-04-28 MED ORDER — PREDNISOLONE ACETATE 1 % OP SUSP
1.0000 [drp] | Freq: Every day | OPHTHALMIC | Status: DC
  Administered 2015-04-28 – 2015-04-30 (×3): 1 [drp] via OPHTHALMIC
  Filled 2015-04-28: qty 1

## 2015-04-28 MED ORDER — TIMOLOL MALEATE 0.5 % OP SOLN
1.0000 [drp] | Freq: Two times a day (BID) | OPHTHALMIC | Status: DC
  Administered 2015-04-28 – 2015-04-30 (×5): 1 [drp] via OPHTHALMIC
  Filled 2015-04-28 (×2): qty 5

## 2015-04-28 MED ORDER — BRIMONIDINE TARTRATE-TIMOLOL 0.2-0.5 % OP SOLN: 1.0000 [drp] | Freq: Two times a day (BID) | OPHTHALMIC | Status: DC

## 2015-04-28 MED ORDER — BRIMONIDINE TARTRATE 0.2 % OP SOLN
1.0000 [drp] | Freq: Two times a day (BID) | OPHTHALMIC | Status: DC
  Administered 2015-04-28 – 2015-04-30 (×5): 1 [drp] via OPHTHALMIC
  Filled 2015-04-28: qty 5

## 2015-04-28 NOTE — Progress Notes (Signed)
  Echocardiogram 2D Echocardiogram has been performed.  Darlina Sicilian M 04/28/2015, 8:58 AM

## 2015-04-28 NOTE — Evaluation (Signed)
Physical Therapy Evaluation Patient Details Name: Alexander Beck MRN: [id number] DOB: 1925-02-22 Today's Date: 04/28/2015   History of Present Illness  81 y.o. male with PMH of mantle cell lymphoma, hypertension, hyperlipidemia, GERD, left eye glaucoma (nearlly blinded), who presents with generalized weakness, diarrhea, slurred speech, R leg weakness, bilateral leg edema. MRI No convincing evidence of acute ischemia. Pt also with PNA.  Clinical Impression  Pt admitted with above diagnosis and presents to PT with functional limitations due to deficits listed below (See PT problem list). Pt needs skilled PT to maximize independence and safety to allow discharge to ST-SNF. Pt lives with elderly wife and per son they have been barely managing at home. Son has been staying with them recently.     Follow Up Recommendations SNF    Equipment Recommendations  Rolling walker with 5" wheels    Recommendations for Other Services       Precautions / Restrictions Precautions Precautions: Fall Restrictions Weight Bearing Restrictions: No      Mobility  Bed Mobility Overal bed mobility: Needs Assistance Bed Mobility: Supine to Sit     Supine to sit: Mod assist     General bed mobility comments: Assist to bring legs off bed, elevate trunk, and hips to EOB.  Transfers Overall transfer level: Needs assistance Equipment used: Rolling walker (2 wheeled);Ambulation equipment used Transfers: Sit to/from [facility] Sit to Stand: +2 physical assistance;Mod assist Stand pivot transfers: +2 physical assistance;Mod assist       General transfer comment: Initially stood with walker but pt with knees beginning to buckle. Returned to sitting. Used Stedy to transfer to chair. Stood from chair with walker with improved stability of knees.  Ambulation/Gait Ambulation/Gait assistance: +2 physical assistance;Min assist Ambulation Distance (Feet): 6 Feet Assistive device: Rolling  walker (2 wheeled) Gait Pattern/deviations: Step-through pattern;Decreased step length - right;Decreased step length - left;Trunk flexed Gait velocity: slow   General Gait Details: Verbal cues to stand more erect and for gait sequence.  Stairs            Wheelchair Mobility    Modified Rankin (Stroke Patients Only) Modified Rankin (Stroke Patients Only) Pre-Morbid Rankin Score: Moderate disability Modified Rankin: Moderately severe disability     Balance Overall balance assessment: Needs assistance Sitting-balance support: No upper extremity supported;Feet supported Sitting balance-Leahy Scale: Good     Standing balance support: Bilateral upper extremity supported Standing balance-Leahy Scale: Poor Standing balance comment: walker and min A for static standing.                             Pertinent Vitals/Pain Pain Assessment: No/denies pain    Home Living Family/patient expects to be discharged to:: Private residence Living Arrangements: Spouse/significant other Available Help at Discharge: Family Type of Home: House Home Access: Stairs to enter Entrance Stairs-Rails: None Entrance Stairs-Number of Steps: 2 Home Layout: One level Home Equipment: None Additional Comments: Son has been staying with parents recently.    Prior Function Level of Independence: Needs assistance   Gait / Transfers Assistance Needed: Amb modified independent without assistive device but refers using walls.     Comments: Son reports pt and his wife are barely managing.     Hand Dominance        Extremity/Trunk Assessment   Upper Extremity Assessment: Defer to OT evaluation           Lower Extremity Assessment: RLE deficits/detail;LLE deficits/detail RLE Deficits / Details: Grossly  3-/5 LLE Deficits / Details: hip 3-/5, knee 3/5     Communication   Communication: No difficulties  Cognition Arousal/Alertness: Awake/alert Behavior During Therapy: WFL for  tasks assessed/performed Overall Cognitive Status: Within Functional Limits for tasks assessed                      General Comments      Exercises        Assessment/Plan    PT Assessment Patient needs continued PT services  PT Diagnosis Difficulty walking;Generalized weakness   PT Problem List Decreased strength;Decreased activity tolerance;Decreased balance;Decreased mobility;Decreased knowledge of use of DME  PT Treatment Interventions DME instruction;Gait training;Functional mobility training;Therapeutic activities;Therapeutic exercise;Balance training;Patient/family education   PT Goals (Current goals can be found in the Care Plan section) Acute Rehab PT Goals Patient Stated Goal: Get better PT Goal Formulation: With patient/family Time For Goal Achievement: 05/12/15 Potential to Achieve Goals: Good    Frequency Min 2X/week   Barriers to discharge        Co-evaluation               End of Session Equipment Utilized During Treatment: Gait belt Activity Tolerance: Patient tolerated treatment well Patient left: in chair;with call bell/phone within reach;with family/visitor present Nurse Communication: Mobility status         Time: 1130-1155 PT Time Calculation (min) (ACUTE ONLY): 25 min   Charges:   PT Evaluation $Initial PT Evaluation Tier I: 1 Procedure PT Treatments $Gait Training: 8-22 mins   PT G Codes:        Romir Klimowicz May 20, 2015, 1:26 PM [employer] PT [phone]

## 2015-04-28 NOTE — Clinical Social Work Note (Signed)
CSW met with patient and his family to discuss SNF placement and process.  CSW explained what is involved in trying to find placement for patient to go to SNF for short term rehab and explained how insurance pays for stay at SNF.  Patient's wife has dementia, and patient requests that his son David [phone] be primary contact person.  Patient's family is agreeable to looking for SNF in [place] area.  CSW to continue to follow patient's progress, formal assessment to follow.  Eric R. Anterhaus, MSW, LCSWA [phone] 04/28/2015 7:06 PM  

## 2015-04-28 NOTE — Progress Notes (Addendum)
PROGRESS NOTE  Alexander Beck GTX:[id number] DOB: 12-30-1924 DOA: 04/27/2015 PCP: No primary care provider on file.  HPI: 81 y.o. male with PMH of mantle cell lymphoma, hypertension, hyperlipidemia, GERD, left eye glaucoma (nearlly blinded), who presents with generalized weakness, diarrhea, slurred speech, R leg weakness, bilateral leg edema.  Subjective / 24 H Interval events - doing well this morning, complains of a cough   Assessment/Plan: Principal Problem:   Left leg weakness Active Problems:   Community acquired pneumonia   Hypertension   Mantle cell lymphoma (HCC)   HLD (hyperlipidemia)   Diarrhea   Generalized weakness   leg swelling   Cough   SOB (shortness of breath)   Aspiration pneumonia (HCC)   Protein-calorie malnutrition, moderate (HCC)  Left leg weakness and slurred speech - MRI negative for CVA  Productive cough and shortness of breath  - Chest x-ray showed possible infiltration in the right lower lobe.  - continue antibiotics, SLP to evaluate - ED started the patient with Rocephin and azithromycin, will switch to vancomycin and Zosyn given possible aspiration pneumonia and he immunocompromised status secondary from Mantle cell lymphoma. - Mucinex for cough  - Albuterol Neb prn for SOB - Urine legionella and S. pneumococcal antigen - Follow up blood culture x2, sputum culture - flu PCR negative  HTN -Continue atenolol, hydralazine - also on Tetrazosin  HLD  - Last LDL was not on record - Continue home medications: Zetia - f/u FLP  Diarrhea: - No recent antibiotic use - C diff PCR negative  Protein-calorie malnutrition, moderate (HCC) - Ensure when able to eat  Bilateral leg swelling  - Etiology is not clear. Renal function and liver function are okay. Potential differential diagnosis include hypoalbuminemia secondary to protein calorie more nutrition - 2D echo with normal EF  Mantle cell lymphoma ([place]):  - [place] - currently under observation - discussed with patient's oncologist Dr. Cruzita Lederer today, he is concerned about disease progression, will stage with a CT chest abdomen pelvis  Generalized weakness:  - Likely multifactorial, including deconditioning, diarrhea, possible stroke, pneumonia, protein calorie malnutrition - PT recommending SNF, family and patient to think about it   Diet: Diet Heart Room service appropriate?: Yes; Fluid consistency:: Thin Fluids: none  DVT Prophylaxis: heparin  Code Status: Full Code Family Communication: d/w son and wife bedside  Disposition Plan: home vs SNF when ready  Barriers to discharge: IV antibiotics  Consultants:  None   Procedures:  None    Antibiotics Vancomycin 11/1 >> Zosyn 11/1 >>   Studies  Dg Chest 2 View  04/27/2015  CLINICAL DATA:  Cough, bilateral lower extremity edema, weakness. Symptoms for a few days. EXAM: CHEST  2 VIEW COMPARISON:  10/13/2014 FINDINGS: Small to moderate right pleural effusion with right lower atelectasis or infiltrate. No confluent opacity on the left. Heart is normal size. No acute bony abnormality. IMPRESSION: Small to moderate right pleural effusion with right lower lobe atelectasis or infiltrate. Electronically Signed   By: Rolm Baptise M.D.   On: 04/27/2015 15:32   Ct Head Wo Contrast  04/27/2015  CLINICAL DATA:  Left-sided weakness today. EXAM: CT HEAD WITHOUT CONTRAST TECHNIQUE: Contiguous axial images were obtained from the base of the skull through the vertex without intravenous contrast. COMPARISON:  10/13/2014 FINDINGS: Mild cerebral atrophy. Patchy low-attenuation changes in the deep white matter consistent with small vessel ischemia. No ventricular dilatation. No mass effect or midline shift. No abnormal extra-axial fluid collections. Gray-white  matter junctions are distinct. Basal cisterns are not effaced. No evidence of acute intracranial hemorrhage. No depressed skull fractures. Visualized  paranasal sinuses and mastoid air cells are not opacified. Postoperative changes in the orbits. Vascular calcifications. IMPRESSION: No acute intracranial abnormalities. Mild chronic atrophy and small vessel ischemic changes. Electronically Signed   By: Lucienne Capers M.D.   On: 04/27/2015 21:54   Mr Brain Wo Contrast  04/27/2015  CLINICAL DATA:  Generalized weakness, diarrhea, slurred speech, RIGHT leg weakness and bilateral leg edema beginning 5 days ago. History of lymphoma, hypertension, hyperlipidemia, LEFT eye blindness. EXAM: MRI HEAD WITHOUT CONTRAST MRA HEAD WITHOUT CONTRAST TECHNIQUE: Multiplanar, multiecho pulse sequences of the brain and surrounding structures were obtained without intravenous contrast. Angiographic images of the head were obtained using MRA technique without contrast. COMPARISON:  CT head April 27, 2015 at 2132 hours FINDINGS: MRI HEAD FINDINGS Punctate focus of reduced diffusion LEFT pons on axial 13/94 not corroborated on coronal T2 view likely artifact. Ventricles and sulci are normal for patient's age. No susceptibility artifact to suggest blood products. Patchy to confluent supratentorial white matter FLAIR T2 hyperintense signal without midline shift, mass effect or mass lesions. Old small RIGHT basal ganglia lacunar infarcts. No abnormal extra-axial fluid collections. Status post bilateral ocular lens implants. Old LEFT medial orbital blowout fracture. Imaged paranasal sinuses are well aerated. Small RIGHT mastoid effusion. Elongated sella in AP dimension, and narrowed in transaxial dimension. No cerebellar tonsillar ectopia. No suspicious calvarial bone marrow signal. MRA HEAD FINDINGS Anterior Circulation: Dolicoectatic internal carotid arteries. Mild luminal irregularity of the LEFT carotid siphon corresponding to dense calcifications on the prior CT head. Bilateral internal carotid arteries are widely patent. Normal flow related enhancement bilateral anterior cerebral  arteries, tiny anterior communicating artery present. Normal flow related enhancement of bilateral middle cerebral arteries, including more distal segments. Posterior circulation: LEFT vertebral artery is dominant. Widely patent bilateral vertebral arteries, patent basilar artery though motion somewhat limits evaluation. Robust bilateral posterior communicating arteries contribute to the posterior circulation. Bilateral posterior cerebral arteries are widely patent. IMPRESSION: MRI HEAD: No convincing evidence of acute ischemia nor acute intracranial process. Involutional changes. Moderate chronic small vessel ischemic disease. Old RIGHT basal ganglia lacunar infarcts. MRA HEAD: No acute large vessel occlusion or high-grade stenosis. Dolichoectasia compatible chronic hypertension. Electronically Signed   By: Elon Alas M.D.   On: 04/27/2015 22:52   Mr Jodene Nam Head/brain Wo Cm  04/27/2015  CLINICAL DATA:  Generalized weakness, diarrhea, slurred speech, RIGHT leg weakness and bilateral leg edema beginning 5 days ago. History of lymphoma, hypertension, hyperlipidemia, LEFT eye blindness. EXAM: MRI HEAD WITHOUT CONTRAST MRA HEAD WITHOUT CONTRAST TECHNIQUE: Multiplanar, multiecho pulse sequences of the brain and surrounding structures were obtained without intravenous contrast. Angiographic images of the head were obtained using MRA technique without contrast. COMPARISON:  CT head April 27, 2015 at 2132 hours FINDINGS: MRI HEAD FINDINGS Punctate focus of reduced diffusion LEFT pons on axial 13/94 not corroborated on coronal T2 view likely artifact. Ventricles and sulci are normal for patient's age. No susceptibility artifact to suggest blood products. Patchy to confluent supratentorial white matter FLAIR T2 hyperintense signal without midline shift, mass effect or mass lesions. Old small RIGHT basal ganglia lacunar infarcts. No abnormal extra-axial fluid collections. Status post bilateral ocular lens implants.  Old LEFT medial orbital blowout fracture. Imaged paranasal sinuses are well aerated. Small RIGHT mastoid effusion. Elongated sella in AP dimension, and narrowed in transaxial dimension. No cerebellar tonsillar ectopia. No suspicious calvarial bone  marrow signal. MRA HEAD FINDINGS Anterior Circulation: Dolicoectatic internal carotid arteries. Mild luminal irregularity of the LEFT carotid siphon corresponding to dense calcifications on the prior CT head. Bilateral internal carotid arteries are widely patent. Normal flow related enhancement bilateral anterior cerebral arteries, tiny anterior communicating artery present. Normal flow related enhancement of bilateral middle cerebral arteries, including more distal segments. Posterior circulation: LEFT vertebral artery is dominant. Widely patent bilateral vertebral arteries, patent basilar artery though motion somewhat limits evaluation. Robust bilateral posterior communicating arteries contribute to the posterior circulation. Bilateral posterior cerebral arteries are widely patent. IMPRESSION: MRI HEAD: No convincing evidence of acute ischemia nor acute intracranial process. Involutional changes. Moderate chronic small vessel ischemic disease. Old RIGHT basal ganglia lacunar infarcts. MRA HEAD: No acute large vessel occlusion or high-grade stenosis. Dolichoectasia compatible chronic hypertension. Electronically Signed   By: Elon Alas M.D.   On: 04/27/2015 22:52    Objective  Filed Vitals:   04/28/15 0113 04/28/15 0547 04/28/15 0817 04/28/15 1025  BP: 107/72 89/48 98/58  109/54  Pulse: 65 68  78  Temp: 98.2 F (36.8 C) 97.8 F (36.6 C)    TempSrc: Oral Oral    Resp: 18 18  24   Height:      Weight:      SpO2: 94% 94%  92%    Intake/Output Summary (Last 24 hours) at 04/28/15 1357 Last data filed at 04/28/15 1314  Gross per 24 hour  Intake    200 ml  Output    502 ml  Net   -302 ml   Filed Weights   04/27/15 1257  Weight: 72.576 kg (160 lb)     Exam:  GENERAL: NAD  HEENT: head NCAT, no scleral icterus. Pupils round and reactive. Mucous membranes are moist. Posterior pharynx clear of any exudate or lesions.  NECK: Supple. No LAD  LUNGS: Clear to auscultation. No wheezing or crackles  HEART: Regular rate and rhythm without murmur. 2+ pulses, no JVD, no peripheral edema  ABDOMEN: Soft, non-distended, non-tender. Positive bowel sounds.  EXTREMITIES: Without any cyanosis or clubbing. Good muscle tone  NEUROLOGIC: Alert and oriented x3. Cranial nerves II through XII are grossly intact. Strength 5/5 in all 4.  Data Reviewed: Basic Metabolic Panel:  Recent Labs Lab 04/27/15 1420  NA 141  K 3.8  CL 110  CO2 24  GLUCOSE 103*  BUN 19  CREATININE 0.90  CALCIUM 8.7*   Liver Function Tests:  Recent Labs Lab 04/27/15 1420  AST 40  ALT 25  ALKPHOS 90  BILITOT 0.6  PROT 6.0*  ALBUMIN 2.4*   CBC:  Recent Labs Lab 04/27/15 1420  WBC 67.2*  HGB 10.6*  HCT 33.0*  MCV 68.9*  PLT 246   Cardiac Enzymes:  Recent Labs Lab 04/27/15 1420  TROPONINI <0.03   BNP (last 3 results)  Recent Labs  04/28/15 0531  BNP 243.3*    Recent Results (from the past 240 hour(s))  Blood culture (routine x 2)     Status: None (Preliminary result)   Collection Time: 04/27/15  4:49 PM  Result Value Ref Range Status   Specimen Description BLOOD RIGHT HAND  Final   Special Requests BOTTLES DRAWN AEROBIC AND ANAEROBIC 4CC EACH  Final   Culture   Final    NO GROWTH < 24 HOURS Performed at [facility]    Report Status PENDING  Incomplete  Blood culture (routine x 2)     Status: None (Preliminary result)   Collection Time: 04/27/15  4:49 PM  Result Value Ref Range Status   Specimen Description BLOOD LEFT AC  Final   Special Requests BOTTLES DRAWN AEROBIC AND ANAEROBIC 5CC EACH  Final   Culture   Final    NO GROWTH < 24 HOURS Performed at [facility]    Report Status PENDING  Incomplete  Culture,  sputum-assessment     Status: None   Collection Time: 04/27/15  8:53 PM  Result Value Ref Range Status   Specimen Description SPUTUM  Final   Special Requests NONE  Final   Sputum evaluation   Final    MICROSCOPIC FINDINGS SUGGEST THAT THIS SPECIMEN IS NOT REPRESENTATIVE OF LOWER RESPIRATORY SECRETIONS. PLEASE RECOLLECT. Gram Stain Report Called to,Read Back By and Verified With: JULIAN @0054  04/28/15 MKELLY    Report Status 04/28/2015 FINAL  Final  C difficile quick scan w PCR reflex     Status: None   Collection Time: 04/28/15 12:18 AM  Result Value Ref Range Status   C Diff antigen NEGATIVE NEGATIVE Final   C Diff toxin NEGATIVE NEGATIVE Final   C Diff interpretation Negative for toxigenic C. difficile  Final     Scheduled Meds: . aspirin  325 mg Oral Daily  . atenolol  50 mg Oral Daily  . atropine  1 drop Left Eye Daily  . brimonidine  1 drop Right Eye BID  . dextromethorphan-guaiFENesin  1 tablet Oral BID  . ezetimibe  10 mg Oral Daily  . feeding supplement (ENSURE ENLIVE)  237 mL Oral BID BM  . heparin  5,000 Units Subcutaneous 3 times per day  . hydrALAZINE  25 mg Oral TID  . pantoprazole  40 mg Oral Daily  . piperacillin-tazobactam (ZOSYN)  IV  3.375 g Intravenous 3 times per day  . prednisoLONE acetate  1 drop Left Eye Daily  . terazosin  10 mg Oral QHS  . timolol  1 drop Right Eye BID  . vancomycin  750 mg Intravenous Q12H   Continuous Infusions:   Marzetta Board, MD [facility] Pager [phone]. If 7 PM - 7 AM, please contact night-coverage at [email or web address], password [facility] 04/28/2015, 1:57 PM  LOS: 1 day

## 2015-04-28 NOTE — Progress Notes (Signed)
VASCULAR LAB PRELIMINARY  PRELIMINARY  PRELIMINARY  PRELIMINARY  Carotid duplex completed.    Preliminary report:  Bilateral:  1-39% ICA stenosis.  Vertebral artery flow is antegrade.     Taylin Mans, RVS 04/28/2015, 9:27 AM

## 2015-04-28 NOTE — Care Management Note (Signed)
Case Management Note  Patient Details  Name: Alexander Beck MRN: [id number] Date of Birth: 08-25-1924  Subjective/Objective:   Date: 04/28/15 Spoke with patient at the bedside along with Shanon Brow, his son [phone] and patient's wife who has dementia per son. Introduced self as Tourist information centre manager and explained role in discharge planning and how to be reached. Verified patient lives in town,  with spouse and son. Has  no DME. Expressed potential need for no other DME at this time. Verified patient anticipates to go SNF at time of discharge. Patient denied needing help with their medication. Patient drives or  is driven by a family member to MD appointments, but has not been driving lately. Verified patient has PCP Reyne Dumas. CSW aware of snf referral.  Son states patient has Medicare as primary and also [employer], ( they just signed him up for th [employer] insurance on this past Thursday).  Plan: CM will continue to follow for discharge planning and [facility] resources.                  Action/Plan:   Expected Discharge Date:                  Expected Discharge Plan:  Skilled Nursing Facility  In-House Referral:  Clinical Social Work  Discharge planning Services  CM Consult  Post Acute Care Choice:    Choice offered to:     DME Arranged:    DME Agency:     HH Arranged:    [place] Agency:     Status of Service:  Completed, signed off  Medicare Important Message Given:    Date Medicare IM Given:    Medicare IM give by:    Date Additional Medicare IM Given:    Additional Medicare Important Message give by:     If discussed at [place] of Stay Meetings, dates discussed:    Additional Comments:  Zenon Mayo, RN 04/28/2015, 2:18 PM

## 2015-04-28 NOTE — Evaluation (Signed)
Clinical/Bedside Swallow Evaluation Patient Details  Name: Alexander Beck MRN: [id number] Date of Birth: [birth year]  Today's Date: 04/28/2015 Time: SLP Start Time (ACUTE ONLY): 0955 SLP Stop Time (ACUTE ONLY): 1012 SLP Time Calculation (min) (ACUTE ONLY): 17 min  Past Medical History:  Past Medical History  Diagnosis Date  . Hypertension   . Mantle cell lymphoma ([place])   . GERD (gastroesophageal reflux disease)   . HLD (hyperlipidemia)    Past Surgical History:  Past Surgical History  Procedure Laterality Date  . Carpal tunnel release    . Tonsillectomy     HPI:  81 y.o. male with PMH of mantle cell lymphoma, hypertension, hyperlipidemia, GERD, left eye glaucoma (nearlly blinded), who presents with generalized weakness, diarrhea, slurred speech, R leg weakness, bilateral leg edema. MRI No convincing evidence of acute ischemia nor acute intracranial process. Involutional changes. Moderate chronic small vessel ischemic disease. Old RIGHT basal ganglia lacunar infarcts. CXR Small to moderate right pleural effusion with right lower lobe atelectasis or infiltrate   Assessment / Plan / Recommendation Clinical Impression  Pt eating breakfast when therapist entered room and began coughing soon after. Pt and daughter-in-law report intermittent coughing earlier this morning. He required frequent verbal cueing to clear oral cavity prior to next bite with moderate egg residue on left side oral cavity and tongue with decreased awareness. Verbal cues for not vocalizing with food in oral cavity. Pt and family all report pt does not exhibit s/s aspiration at home. Continue regular diet texture, thin liquids, small bites and check for pocketing. No further ST needed.     Aspiration Risk   (mild-mod)    Diet Recommendation Age appropriate regular solids;Thin   Medication Administration: Whole meds with liquid Compensations: Slow rate;Small sips/bites;Check for pocketing    Other  Recommendations  Oral Care Recommendations: Oral care BID   Follow Up Recommendations       Frequency and Duration        Pertinent Vitals/Pain none    SLP Swallow Goals     Swallow Study Prior Functional Status       General Other Pertinent Information: 81 y.o. male with PMH of mantle cell lymphoma, hypertension, hyperlipidemia, GERD, left eye glaucoma (nearlly blinded), who presents with generalized weakness, diarrhea, slurred speech, R leg weakness, bilateral leg edema. MRI No convincing evidence of acute ischemia nor acute intracranial process. Involutional changes. Moderate chronic small vessel ischemic disease. Old RIGHT basal ganglia lacunar infarcts. CXR Small to moderate right pleural effusion with right lower lobe atelectasis or infiltrate Type of Study: Bedside swallow evaluation Diet Prior to this Study: Regular;Thin liquids Temperature Spikes Noted: No Respiratory Status: Room air History of Recent Intubation: No Behavior/Cognition: Alert;Cooperative;Pleasant mood Oral Cavity - Dentition:  (missing lower posterior ) Self-Feeding Abilities: Able to feed self Patient Positioning: Upright in bed Baseline Vocal Quality: Normal Volitional Cough: Strong Volitional Swallow: Able to elicit    Oral/Motor/Sensory Function Overall Oral Motor/Sensory Function: Appears within functional limits for tasks assessed   Ice Chips Ice chips: Not tested   Thin Liquid Thin Liquid: Within functional limits Presentation: Straw    Nectar Thick Nectar Thick Liquid: Not tested   Honey Thick Honey Thick Liquid: Not tested   Puree Puree: Not tested   Solid   GO    Solid: Impaired Oral Phase Impairments: Poor awareness of bolus;Impaired anterior to posterior transit Oral Phase Functional Implications: Left lateral sulci pocketing;Oral residue       Houston Siren 04/28/2015,10:29 AM  Orbie Pyo  Katalia Choma M.Ed [employer] [phone]

## 2015-04-29 DIAGNOSIS — J189 Pneumonia, unspecified organism: Secondary | ICD-10-CM

## 2015-04-29 DIAGNOSIS — R05 Cough: Secondary | ICD-10-CM

## 2015-04-29 DIAGNOSIS — I10 Essential (primary) hypertension: Secondary | ICD-10-CM

## 2015-04-29 LAB — CBC
HCT: 32 % — ABNORMAL LOW (ref 39.0–52.0)
HEMOGLOBIN: 10.1 g/dL — AB (ref 13.0–17.0)
MCH: 22.6 pg — ABNORMAL LOW (ref 26.0–34.0)
MCHC: 31.6 g/dL (ref 30.0–36.0)
MCV: 71.6 fL — ABNORMAL LOW (ref 78.0–100.0)
PLATELETS: 209 10*3/uL (ref 150–400)
RBC: 4.47 MIL/uL (ref 4.22–5.81)
RDW: 18.5 % — ABNORMAL HIGH (ref 11.5–15.5)
WBC: 62 10*3/uL — AB (ref 4.0–10.5)

## 2015-04-29 LAB — BASIC METABOLIC PANEL
Anion gap: 8 (ref 5–15)
BUN: 21 mg/dL — ABNORMAL HIGH (ref 6–20)
CHLORIDE: 107 mmol/L (ref 101–111)
CO2: 22 mmol/L (ref 22–32)
Calcium: 8.3 mg/dL — ABNORMAL LOW (ref 8.9–10.3)
Creatinine, Ser: 1.16 mg/dL (ref 0.61–1.24)
GFR, EST NON AFRICAN AMERICAN: 54 mL/min — AB (ref >60)
Glucose, Bld: 72 mg/dL (ref 65–99)
POTASSIUM: 3.2 mmol/L — AB (ref 3.5–5.1)
SODIUM: 137 mmol/L (ref 135–145)

## 2015-04-29 LAB — EXPECTORATED SPUTUM ASSESSMENT W REFEX TO RESP CULTURE

## 2015-04-29 LAB — URINE CULTURE

## 2015-04-29 LAB — HEMOGLOBIN A1C
Hgb A1c MFr Bld: 6 % — ABNORMAL HIGH (ref 4.8–5.6)
MEAN PLASMA GLUCOSE: 126 mg/dL

## 2015-04-29 LAB — LEGIONELLA PNEUMOPHILA SEROGP 1 UR AG: L. pneumophila Serogp 1 Ur Ag: NEGATIVE

## 2015-04-29 LAB — EXPECTORATED SPUTUM ASSESSMENT W GRAM STAIN, RFLX TO RESP C

## 2015-04-29 MED ORDER — POTASSIUM CHLORIDE CRYS ER 20 MEQ PO TBCR
40.0000 meq | EXTENDED_RELEASE_TABLET | Freq: Once | ORAL | Status: AC
  Administered 2015-04-29: 40 meq via ORAL
  Filled 2015-04-29: qty 2

## 2015-04-29 MED ORDER — SODIUM CHLORIDE 0.9 % IV BOLUS (SEPSIS)
250.0000 mL | Freq: Once | INTRAVENOUS | Status: AC
  Administered 2015-04-29: 250 mL via INTRAVENOUS

## 2015-04-29 NOTE — Evaluation (Signed)
Occupational Therapy Evaluation Patient Details Name: Alexander Beck MRN: [id number] DOB: 02/28/1925 Today's Date: 04/29/2015    History of Present Illness 81 y.o. male with PMH of mantle cell lymphoma, hypertension, hyperlipidemia, GERD, left eye glaucoma (nearlly blinded), who presents with generalized weakness, diarrhea, slurred speech, R leg weakness, bilateral leg edema. MRI No convincing evidence of acute ischemia. Pt also with PNA.   Clinical Impression   Pt admitted with above. He demonstrates the below listed deficits and will benefit from continued OT to maximize safety and independence with BADLs.  Pt demonstrates generalized weakness and impaired balance.  He currently requires mod - max A for ADLs.  Recommend SNF level rehab at discharge.       Follow Up Recommendations  SNF    Equipment Recommendations  None recommended by OT    Recommendations for Other Services       Precautions / Restrictions Precautions Precautions: Fall      Mobility Bed Mobility                  Transfers Overall transfer level: Needs assistance Equipment used: 1 person hand held assist   Sit to Stand: Mod assist         General transfer comment: Requires increased time and assist to extend hips and trunk     Balance Overall balance assessment: Needs assistance Sitting-balance support: Feet supported Sitting balance-Leahy Scale: Fair     Standing balance support: Bilateral upper extremity supported Standing balance-Leahy Scale: Poor Standing balance comment: requires mod a                            ADL Overall ADL's : Needs assistance/impaired Eating/Feeding: Set up;Sitting   Grooming: Wash/dry hands;Wash/dry face;Oral care;Brushing hair;Set up;Sitting   Upper Body Bathing: Minimal assitance;Sitting   Lower Body Bathing: Maximal assistance;Sit to/from stand   Upper Body Dressing : Moderate assistance;Sitting   Lower Body Dressing: Total  assistance;Sit to/from stand   Toilet Transfer: Moderate assistance;Stand-pivot;BSC   Toileting- Clothing Manipulation and Hygiene: Total assistance;Sit to/from stand       Functional mobility during ADLs: Moderate assistance (sit to stand only ) General ADL Comments: Pt requires encouragement due to feeling fatigued      Vision     Perception     Praxis      Pertinent Vitals/Pain Pain Assessment: No/denies pain     Hand Dominance Right   Extremity/Trunk Assessment Upper Extremity Assessment Upper Extremity Assessment: RUE deficits/detail;Generalized weakness RUE Deficits / Details: Pt reports long standing arthritis Rt shoulder with limited ROM    Lower Extremity Assessment Lower Extremity Assessment: Defer to PT evaluation   Cervical / Trunk Assessment Cervical / Trunk Assessment: Kyphotic   Communication Communication Communication: No difficulties   Cognition Arousal/Alertness: Lethargic Behavior During Therapy: WFL for tasks assessed/performed Overall Cognitive Status: Within Functional Limits for tasks assessed                     General Comments       Exercises       Shoulder Instructions      Home Living Family/patient expects to be discharged to:: Skilled nursing facility                                        Prior Functioning/Environment Level of Independence: Needs assistance  Gait /  Transfers Assistance Needed: Amb modified independent without assistive device but refers using walls. ADL's / Homemaking Assistance Needed: Pt indicates wife or family intermittently had to assist him with LB ADLs         OT Diagnosis: Generalized weakness   OT Problem List: Decreased strength;Decreased activity tolerance;Impaired balance (sitting and/or standing);Decreased safety awareness;Decreased knowledge of use of DME or AE   OT Treatment/Interventions: Self-care/ADL training;DME and/or AE instruction;Therapeutic  activities;Patient/family education;Balance training    OT Goals(Current goals can be found in the care plan section) Acute Rehab OT Goals Patient Stated Goal: Get better OT Goal Formulation: With patient Time For Goal Achievement: 05/13/15 Potential to Achieve Goals: Good ADL Goals Pt Will Perform Grooming: with min assist;standing Pt Will Perform Upper Body Bathing: with set-up;sitting Pt Will Perform Lower Body Bathing: with min assist;sit to/from stand Pt Will Transfer to Toilet: with min assist;stand pivot transfer;bedside commode;grab bars Pt Will Perform Toileting - Clothing Manipulation and hygiene: with min assist;sit to/from stand  OT Frequency: Min 2X/week   Barriers to D/C: Decreased caregiver support          Co-evaluation              End of Session Nurse Communication: Mobility status  Activity Tolerance: Patient tolerated treatment well Patient left: in chair;with call bell/phone within reach   Time: 1310-1333 OT Time Calculation (min): 23 min Charges:  OT General Charges $OT Visit: 1 Procedure OT Evaluation $Initial OT Evaluation Tier I: 1 Procedure OT Treatments $Self Care/Home Management : 8-22 mins G-Codes:    Dhwani Venkatesh M May 15, 2015, 1:43 PM

## 2015-04-29 NOTE — Progress Notes (Addendum)
PROGRESS NOTE  Alexander Beck IRC:[id number] DOB: [birth year] DOA: 04/27/2015 PCP: No primary care provider on file.  HPI: 81 y.o. male with PMH of mantle cell lymphoma, hypertension, hyperlipidemia, GERD, left eye glaucoma (nearlly blinded), who presents with generalized weakness, diarrhea, slurred speech, R leg weakness, bilateral leg edema.  Subjective / 24 H Interval events - doing well this morning, no chest pain / palpitations   Assessment/Plan: Principal Problem:   Left leg weakness Active Problems:   Community acquired pneumonia   Hypertension   Mantle cell lymphoma (HCC)   HLD (hyperlipidemia)   Diarrhea   Generalized weakness   leg swelling   Cough   SOB (shortness of breath)   Aspiration pneumonia (HCC)   Protein-calorie malnutrition, moderate (HCC)   Pressure ulcer  Mantle cell lymphoma ([place]):  - has been followed up in [place] - currently under observation - discussed with patient's oncologist Dr. Cruzita Lederer yesterday and again today, - CT staging chest abdomen pelvis with significant disease progression which likely accounts for his symptoms / weakness. Per his oncologist, patient is not a good chemotherapy candidate and has been recommending palliative consult given progression and patient's declining functional status.  - discussed with family, they wish to talk to patient's oncologist and will schedule an appointment early next week. Discussed palliative care, family agrees with further discussing. Will consult palliative.   Left leg weakness and slurred speech - MRI negative for CVA  Productive cough and shortness of breath  - Chest x-ray showed possible infiltration in the right lower lobe. CT scan with pleural effusion, likely malignant - continue antibiotics, narrow to Levaquin in am - Mucinex for cough  - Albuterol Neb prn for SOB - Urine legionella pending, and S. pneumococcal antigen negative - Follow up blood culture x2, sputum culture - flu PCR  negative  HTN - Continue atenolol - also on Tetrazosin - d/c hydralazine due to LE swelling, hypotension  HLD  - Last LDL was not on record - Continue home medications: Zetia - f/u [facility]  Diarrhea: - No recent antibiotic use - C diff PCR negative  Protein-calorie malnutrition, moderate (HCC) - Ensure  Bilateral leg swelling  - Etiology is not clear. Renal function and liver function are okay. Potential differential diagnosis include hypoalbuminemia secondary to protein calorie more nutrition - 2D echo with normal EF  Generalized weakness:  - Likely multifactorial, including deconditioning, diarrhea, possible stroke, pneumonia, protein calorie malnutrition - PT recommending SNF, SW consulted   Diet: Diet Heart Room service appropriate?: Yes; Fluid consistency:: Thin Fluids: none  DVT Prophylaxis: heparin  Code Status: Full Code Family Communication: d/w son and wife bedside  Disposition Plan: home vs SNF when ready  Barriers to discharge: IV antibiotics  Consultants:  None   Procedures:  None    Antibiotics Vancomycin 11/1 >> Zosyn 11/1 >>   Studies  Dg Chest 2 View  04/27/2015  CLINICAL DATA:  Cough, bilateral lower extremity edema, weakness. Symptoms for a few days. EXAM: CHEST  2 VIEW COMPARISON:  10/13/2014 FINDINGS: Small to moderate right pleural effusion with right lower atelectasis or infiltrate. No confluent opacity on the left. Heart is normal size. No acute bony abnormality. IMPRESSION: Small to moderate right pleural effusion with right lower lobe atelectasis or infiltrate. Electronically Signed   By: Rolm Baptise M.D.   On: 04/27/2015 15:32   Ct Head Wo Contrast  04/27/2015  CLINICAL DATA:  Left-sided weakness today. EXAM: CT HEAD WITHOUT CONTRAST TECHNIQUE: Contiguous axial images were  obtained from the base of the skull through the vertex without intravenous contrast. COMPARISON:  10/13/2014 FINDINGS: Mild cerebral atrophy. Patchy low-attenuation  changes in the deep white matter consistent with small vessel ischemia. No ventricular dilatation. No mass effect or midline shift. No abnormal extra-axial fluid collections. Gray-white matter junctions are distinct. Basal cisterns are not effaced. No evidence of acute intracranial hemorrhage. No depressed skull fractures. Visualized paranasal sinuses and mastoid air cells are not opacified. Postoperative changes in the orbits. Vascular calcifications. IMPRESSION: No acute intracranial abnormalities. Mild chronic atrophy and small vessel ischemic changes. Electronically Signed   By: Lucienne Capers M.D.   On: 04/27/2015 21:54   Ct Chest W Contrast  04/28/2015  CLINICAL DATA:  81 year old male inpatient with a reported history of mantle cell lymphoma. Restaging. EXAM: CT CHEST, ABDOMEN, AND PELVIS WITH CONTRAST TECHNIQUE: Multidetector CT imaging of the chest, abdomen and pelvis was performed following the standard protocol during bolus administration of intravenous contrast. CONTRAST:  134mL OMNIPAQUE IOHEXOL 300 MG/ML  SOLN COMPARISON:  05/29/2014 PET-CT.  09/27/2011 CT abdomen/pelvis. FINDINGS: CT CHEST FINDINGS Mediastinum/Nodes: Normal heart size. No pericardial fluid/thickening. There is atherosclerosis of the thoracic aorta, the great vessels of the mediastinum and the coronary arteries, including calcified atherosclerotic plaque in the left anterior descending, left circumflex and right coronary arteries. Great vessels are normal in course and caliber. No central pulmonary emboli. Normal visualized thyroid. Mildly patulous and otherwise grossly normal esophagus. There is moderate bilateral axillary lymphadenopathy, with a dominant 2.0 cm enlarged right axillary node (series 201/ image 22), increased from 1.1 cm on 05/29/2014. An enlarged 1.7 cm left axillary node (201/ image 21) is increased from 0.9 cm. Anterior mediastinal 1.1 cm mass (201/20) is increased from 0.8 cm, either thymic hyperplasia or  anterior mediastinal lymphadenopathy. Moderate mediastinal lymphadenopathy in the paratracheal and subcarinal territories has increased. For example an enlarged 1.6 cm right paratracheal node (201/15) is increased from 0.6 cm. A 1.9 cm subcarinal node (201/31) is increased from 0.7 cm. Mild-to-moderate bilateral hilar lymphadenopathy is increased bilaterally. Coarsely calcified nodes from prior granulomatous disease are again noted in the left hilum. Lungs/Pleura: No pneumothorax. New moderate right pleural effusion with mild posterior right pleural thickening up to 7 mm thickness. No left pleural effusion. There is new peribronchovascular interstitial thickening and nodularity in the right greater than left lungs. A subpleural 1.0 cm anterior left lower lobe pulmonary nodule (203/35) is increased from 0.6 cm. There is moderate passive atelectasis in the dependent right lung. Musculoskeletal: No aggressive appearing focal osseous lesions. Severe right glenohumeral joint osteoarthritis. Moderate degenerative changes in the thoracic spine. There is anasarca. CT ABDOMEN PELVIS FINDINGS Hepatobiliary: Normal liver with no liver mass. Normal gallbladder with no radiopaque cholelithiasis. No biliary ductal dilatation. Pancreas: Normal, with no mass or duct dilation. Spleen: Normal size. No mass. Adrenals/Urinary Tract: Normal adrenals. Three subcentimeter hypodense lesions in the right kidney are too small to characterize. No hydronephrosis. Normal bladder. Stomach/Bowel: Grossly normal stomach. Normal caliber small bowel with no small bowel wall thickening. Appendix is not discretely visualized. There is diffuse wall thickening of the entire colon and rectum. Vascular/Lymphatic: Atherosclerotic nonaneurysmal abdominal aorta. Patent portal, splenic, hepatic and renal veins. There is new bulky mesenteric lymphadenopathy, for example a 3.4 cm central mesenteric node (201/79) . There is new bilateral mild inguinal  lymphadenopathy. Reproductive: Stable moderate prostatomegaly. Other: No pneumoperitoneum, ascites or focal fluid collection. Musculoskeletal: No aggressive appearing focal osseous lesions. Stable Paget's disease of the right proximal femur. Severe  degenerative changes in the lumbar spine. Anasarca. IMPRESSION: 1. Interval progression of bilateral axillary, mediastinal, bilateral hilar, mesenteric and bilateral inguinal lymphadenopathy, in keeping with progressive lymphoma. 2. New peribronchovascular interstitial thickening and nodularity involving the right greater than left lungs. Enlarging left lower lobe subpleural pulmonary nodule. 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 3. New moderate right pleural effusion with mild pleural thickening, suggesting a malignant effusion. 4. Anasarca. Diffuse colorectal wall thickening, likely due to non inflammatory edema such as due to hypoalbuminemia given the anasarca. 5. Atherosclerosis, including three-vessel coronary artery disease. Please note that although the presence of coronary artery calcium documents the presence of coronary artery disease, the severity of this disease and any potential stenosis cannot be assessed on this non-gated CT examination. Electronically Signed   By: Ilona Sorrel M.D.   On: 04/28/2015 18:56   Mr Brain Wo Contrast  04/27/2015  CLINICAL DATA:  Generalized weakness, diarrhea, slurred speech, RIGHT leg weakness and bilateral leg edema beginning 5 days ago. History of lymphoma, hypertension, hyperlipidemia, LEFT eye blindness. EXAM: MRI HEAD WITHOUT CONTRAST MRA HEAD WITHOUT CONTRAST TECHNIQUE: Multiplanar, multiecho pulse sequences of the brain and surrounding structures were obtained without intravenous contrast. Angiographic images of the head were obtained using MRA technique without contrast. COMPARISON:  CT head April 27, 2015 at 2132 hours FINDINGS: MRI HEAD FINDINGS Punctate focus of reduced diffusion LEFT  pons on axial 13/94 not corroborated on coronal T2 view likely artifact. Ventricles and sulci are normal for patient's age. No susceptibility artifact to suggest blood products. Patchy to confluent supratentorial white matter FLAIR T2 hyperintense signal without midline shift, mass effect or mass lesions. Old small RIGHT basal ganglia lacunar infarcts. No abnormal extra-axial fluid collections. Status post bilateral ocular lens implants. Old LEFT medial orbital blowout fracture. Imaged paranasal sinuses are well aerated. Small RIGHT mastoid effusion. Elongated sella in AP dimension, and narrowed in transaxial dimension. No cerebellar tonsillar ectopia. No suspicious calvarial bone marrow signal. MRA HEAD FINDINGS Anterior Circulation: Dolicoectatic internal carotid arteries. Mild luminal irregularity of the LEFT carotid siphon corresponding to dense calcifications on the prior CT head. Bilateral internal carotid arteries are widely patent. Normal flow related enhancement bilateral anterior cerebral arteries, tiny anterior communicating artery present. Normal flow related enhancement of bilateral middle cerebral arteries, including more distal segments. Posterior circulation: LEFT vertebral artery is dominant. Widely patent bilateral vertebral arteries, patent basilar artery though motion somewhat limits evaluation. Robust bilateral posterior communicating arteries contribute to the posterior circulation. Bilateral posterior cerebral arteries are widely patent. IMPRESSION: MRI HEAD: No convincing evidence of acute ischemia nor acute intracranial process. Involutional changes. Moderate chronic small vessel ischemic disease. Old RIGHT basal ganglia lacunar infarcts. MRA HEAD: No acute large vessel occlusion or high-grade stenosis. Dolichoectasia compatible chronic hypertension. Electronically Signed   By: Elon Alas M.D.   On: 04/27/2015 22:52   Ct Abdomen Pelvis W Contrast  04/28/2015  CLINICAL DATA:   81 year old male inpatient with a reported history of mantle cell lymphoma. Restaging. EXAM: CT CHEST, ABDOMEN, AND PELVIS WITH CONTRAST TECHNIQUE: Multidetector CT imaging of the chest, abdomen and pelvis was performed following the standard protocol during bolus administration of intravenous contrast. CONTRAST:  116mL OMNIPAQUE IOHEXOL 300 MG/ML  SOLN COMPARISON:  05/29/2014 PET-CT.  09/27/2011 CT abdomen/pelvis. FINDINGS: CT CHEST FINDINGS Mediastinum/Nodes: Normal heart size. No pericardial fluid/thickening. There is atherosclerosis of the thoracic aorta, the great vessels of the mediastinum and the coronary arteries, including calcified atherosclerotic plaque in the left anterior descending, left circumflex  and right coronary arteries. Great vessels are normal in course and caliber. No central pulmonary emboli. Normal visualized thyroid. Mildly patulous and otherwise grossly normal esophagus. There is moderate bilateral axillary lymphadenopathy, with a dominant 2.0 cm enlarged right axillary node (series 201/ image 22), increased from 1.1 cm on 05/29/2014. An enlarged 1.7 cm left axillary node (201/ image 21) is increased from 0.9 cm. Anterior mediastinal 1.1 cm mass (201/20) is increased from 0.8 cm, either thymic hyperplasia or anterior mediastinal lymphadenopathy. Moderate mediastinal lymphadenopathy in the paratracheal and subcarinal territories has increased. For example an enlarged 1.6 cm right paratracheal node (201/15) is increased from 0.6 cm. A 1.9 cm subcarinal node (201/31) is increased from 0.7 cm. Mild-to-moderate bilateral hilar lymphadenopathy is increased bilaterally. Coarsely calcified nodes from prior granulomatous disease are again noted in the left hilum. Lungs/Pleura: No pneumothorax. New moderate right pleural effusion with mild posterior right pleural thickening up to 7 mm thickness. No left pleural effusion. There is new peribronchovascular interstitial thickening and nodularity in the  right greater than left lungs. A subpleural 1.0 cm anterior left lower lobe pulmonary nodule (203/35) is increased from 0.6 cm. There is moderate passive atelectasis in the dependent right lung. Musculoskeletal: No aggressive appearing focal osseous lesions. Severe right glenohumeral joint osteoarthritis. Moderate degenerative changes in the thoracic spine. There is anasarca. CT ABDOMEN PELVIS FINDINGS Hepatobiliary: Normal liver with no liver mass. Normal gallbladder with no radiopaque cholelithiasis. No biliary ductal dilatation. Pancreas: Normal, with no mass or duct dilation. Spleen: Normal size. No mass. Adrenals/Urinary Tract: Normal adrenals. Three subcentimeter hypodense lesions in the right kidney are too small to characterize. No hydronephrosis. Normal bladder. Stomach/Bowel: Grossly normal stomach. Normal caliber small bowel with no small bowel wall thickening. Appendix is not discretely visualized. There is diffuse wall thickening of the entire colon and rectum. Vascular/Lymphatic: Atherosclerotic nonaneurysmal abdominal aorta. Patent portal, splenic, hepatic and renal veins. There is new bulky mesenteric lymphadenopathy, for example a 3.4 cm central mesenteric node (201/79) . There is new bilateral mild inguinal lymphadenopathy. Reproductive: Stable moderate prostatomegaly. Other: No pneumoperitoneum, ascites or focal fluid collection. Musculoskeletal: No aggressive appearing focal osseous lesions. Stable Paget's disease of the right proximal femur. Severe degenerative changes in the lumbar spine. Anasarca. IMPRESSION: 1. Interval progression of bilateral axillary, mediastinal, bilateral hilar, mesenteric and bilateral inguinal lymphadenopathy, in keeping with progressive lymphoma. 2. New peribronchovascular interstitial thickening and nodularity involving the right greater than left lungs. Enlarging left lower lobe subpleural pulmonary nodule. These findings likely represent progressive pulmonary  lymphoma. 3. New moderate right pleural effusion with mild pleural thickening, suggesting a malignant effusion. 4. Anasarca. Diffuse colorectal wall thickening, likely due to non inflammatory edema such as due to hypoalbuminemia given the anasarca. 5. Atherosclerosis, including three-vessel coronary artery disease. Please note that although the presence of coronary artery calcium documents the presence of coronary artery disease, the severity of this disease and any potential stenosis cannot be assessed on this non-gated CT examination. Electronically Signed   By: Ilona Sorrel M.D.   On: 04/28/2015 18:56   Mr Jodene Nam Head/brain Wo Cm  04/27/2015  CLINICAL DATA:  Generalized weakness, diarrhea, slurred speech, RIGHT leg weakness and bilateral leg edema beginning 5 days ago. History of lymphoma, hypertension, hyperlipidemia, LEFT eye blindness. EXAM: MRI HEAD WITHOUT CONTRAST MRA HEAD WITHOUT CONTRAST TECHNIQUE: Multiplanar, multiecho pulse sequences of the brain and surrounding structures were obtained without intravenous contrast. Angiographic images of the head were obtained using MRA technique without contrast. COMPARISON:  CT head  April 27, 2015 at 2132 hours FINDINGS: MRI HEAD FINDINGS Punctate focus of reduced diffusion LEFT pons on axial 13/94 not corroborated on coronal T2 view likely artifact. Ventricles and sulci are normal for patient's age. No susceptibility artifact to suggest blood products. Patchy to confluent supratentorial white matter FLAIR T2 hyperintense signal without midline shift, mass effect or mass lesions. Old small RIGHT basal ganglia lacunar infarcts. No abnormal extra-axial fluid collections. Status post bilateral ocular lens implants. Old LEFT medial orbital blowout fracture. Imaged paranasal sinuses are well aerated. Small RIGHT mastoid effusion. Elongated sella in AP dimension, and narrowed in transaxial dimension. No cerebellar tonsillar ectopia. No suspicious calvarial bone marrow  signal. MRA HEAD FINDINGS Anterior Circulation: Dolicoectatic internal carotid arteries. Mild luminal irregularity of the LEFT carotid siphon corresponding to dense calcifications on the prior CT head. Bilateral internal carotid arteries are widely patent. Normal flow related enhancement bilateral anterior cerebral arteries, tiny anterior communicating artery present. Normal flow related enhancement of bilateral middle cerebral arteries, including more distal segments. Posterior circulation: LEFT vertebral artery is dominant. Widely patent bilateral vertebral arteries, patent basilar artery though motion somewhat limits evaluation. Robust bilateral posterior communicating arteries contribute to the posterior circulation. Bilateral posterior cerebral arteries are widely patent. IMPRESSION: MRI HEAD: No convincing evidence of acute ischemia nor acute intracranial process. Involutional changes. Moderate chronic small vessel ischemic disease. Old RIGHT basal ganglia lacunar infarcts. MRA HEAD: No acute large vessel occlusion or high-grade stenosis. Dolichoectasia compatible chronic hypertension. Electronically Signed   By: Elon Alas M.D.   On: 04/27/2015 22:52    Objective  Filed Vitals:   04/29/15 0206 04/29/15 0443 04/29/15 0610 04/29/15 1007  BP: 91/48 97/54 109/55 100/53  Pulse: 69 73 79 59  Temp: 97.7 F (36.5 C)     TempSrc: Oral     Resp: 18 18 18    Height:      Weight:      SpO2: 90% 94% 94%     Intake/Output Summary (Last 24 hours) at 04/29/15 1207 Last data filed at 04/28/15 1314  Gross per 24 hour  Intake    200 ml  Output      0 ml  Net    200 ml   Filed Weights   04/27/15 1257  Weight: 72.576 kg (160 lb)    Exam:  GENERAL: NAD  HEENT: head NCAT, no scleral icterus. Pupils round and reactive. Mucous membranes are moist. Posterior pharynx clear of any exudate or lesions.  NECK: Supple. No LAD  LUNGS: Clear to auscultation. No wheezing or crackles  HEART: Regular  rate and rhythm without murmur. 2+ pulses, no JVD, 2+ peripheral edema  ABDOMEN: Soft, non-distended, non-tender. Positive bowel sounds.  EXTREMITIES: Without any cyanosis or clubbing. Good muscle tone  NEUROLOGIC: Alert and oriented x3. Cranial nerves II through XII are grossly intact. Strength 5/5 in all 4.  Data Reviewed: Basic Metabolic Panel:  Recent Labs Lab 04/27/15 1420 04/29/15 0611  NA 141 137  K 3.8 3.2*  CL 110 107  CO2 24 22  GLUCOSE 103* 72  BUN 19 21*  CREATININE 0.90 1.16  CALCIUM 8.7* 8.3*   Liver Function Tests:  Recent Labs Lab 04/27/15 1420  AST 40  ALT 25  ALKPHOS 90  BILITOT 0.6  PROT 6.0*  ALBUMIN 2.4*   CBC:  Recent Labs Lab 04/27/15 1420 04/29/15 0611  WBC 67.2* 62.0*  HGB 10.6* 10.1*  HCT 33.0* 32.0*  MCV 68.9* 71.6*  PLT 246 209   Cardiac  Enzymes:  Recent Labs Lab 04/27/15 1420  TROPONINI <0.03   BNP (last 3 results)  Recent Labs  04/28/15 0531  BNP 243.3*    Recent Results (from the past 240 hour(s))  Blood culture (routine x 2)     Status: None (Preliminary result)   Collection Time: 04/27/15  4:49 PM  Result Value Ref Range Status   Specimen Description BLOOD RIGHT HAND  Final   Special Requests BOTTLES DRAWN AEROBIC AND ANAEROBIC 4CC EACH  Final   Culture   Final    NO GROWTH < 24 HOURS Performed at [facility]    Report Status PENDING  Incomplete  Blood culture (routine x 2)     Status: None (Preliminary result)   Collection Time: 04/27/15  4:49 PM  Result Value Ref Range Status   Specimen Description BLOOD LEFT AC  Final   Special Requests BOTTLES DRAWN AEROBIC AND ANAEROBIC 5CC EACH  Final   Culture   Final    NO GROWTH < 24 HOURS Performed at [facility]    Report Status PENDING  Incomplete  Culture, sputum-assessment     Status: None   Collection Time: 04/27/15  8:53 PM  Result Value Ref Range Status   Specimen Description SPUTUM  Final   Special Requests NONE  Final   Sputum  evaluation   Final    MICROSCOPIC FINDINGS SUGGEST THAT THIS SPECIMEN IS NOT REPRESENTATIVE OF LOWER RESPIRATORY SECRETIONS. PLEASE RECOLLECT. Gram Stain Report Called to,Read Back By and Verified With: JULIAN @0054  04/28/15 MKELLY    Report Status 04/28/2015 FINAL  Final  Urine culture     Status: None   Collection Time: 04/28/15 12:18 AM  Result Value Ref Range Status   Specimen Description URINE, CLEAN CATCH  Final   Special Requests NONE  Final   Culture MULTIPLE SPECIES PRESENT, SUGGEST RECOLLECTION  Final   Report Status 04/29/2015 FINAL  Final  C difficile quick scan w PCR reflex     Status: None   Collection Time: 04/28/15 12:18 AM  Result Value Ref Range Status   C Diff antigen NEGATIVE NEGATIVE Final   C Diff toxin NEGATIVE NEGATIVE Final   C Diff interpretation Negative for toxigenic C. difficile  Final  Culture, expectorated sputum-assessment     Status: None   Collection Time: 04/29/15 10:35 AM  Result Value Ref Range Status   Specimen Description EXPECTORATED SPUTUM  Final   Special Requests NONE  Final   Sputum evaluation   Final    THIS SPECIMEN IS ACCEPTABLE. RESPIRATORY CULTURE REPORT TO FOLLOW.   Report Status 04/29/2015 FINAL  Final     Scheduled Meds: . aspirin  325 mg Oral Daily  . atenolol  50 mg Oral Daily  . atropine  1 drop Left Eye Daily  . brimonidine  1 drop Right Eye BID  . dextromethorphan-guaiFENesin  1 tablet Oral BID  . ezetimibe  10 mg Oral Daily  . feeding supplement (ENSURE ENLIVE)  237 mL Oral BID BM  . heparin  5,000 Units Subcutaneous 3 times per day  . pantoprazole  40 mg Oral Daily  . piperacillin-tazobactam (ZOSYN)  IV  3.375 g Intravenous 3 times per day  . prednisoLONE acetate  1 drop Left Eye Daily  . terazosin  10 mg Oral QHS  . timolol  1 drop Right Eye BID  . vancomycin  750 mg Intravenous Q12H   Continuous Infusions:   Marzetta Board, MD [facility] Pager [phone]. If  7 PM - 7 AM, please contact night-coverage at  [email or web address], password [facility] 04/29/2015, 12:07 PM  LOS: 2 days

## 2015-04-29 NOTE — Clinical Social Work Note (Signed)
Clinical Social Work Assessment  Patient Details  Name: Alexander Beck MRN: [id number] Date of Birth: 03-Dec-1924  Date of referral:  04/28/15               Reason for consult:  Facility Placement                Permission sought to share information with:  Family Supports Permission granted to share information::  Yes, Verbal Permission Granted  Name::     Alexander Beck  Agency::  SNF admissions  Relationship::     Contact Information:     Housing/Transportation Living arrangements for the past 2 months:  [place] of Information:  Patient, Adult Children Patient Interpreter Needed:  None Criminal Activity/Legal Involvement Pertinent to Current Situation/Hospitalization:  No - Comment as needed Significant Relationships:  Adult Children, Spouse Lives with:  Spouse Do you feel safe going back to the place where you live?  Yes (Patient feels safe to return home once he has received some physical therapy at SNF.Marland Kitchen) Need for family participation in patient care:  Yes (Comment) (Patient requests that his son help make decisions for SNFs.)  Care giving concerns:  Patient and family feel he will benefit from some short term rehab.   Social Worker assessment / plan: Patient is a 81 year old male who lives with his wife who has dementia.  Patient is alert and oriented x4, however due to his current medical condition patient needs some therapy in order to get his strength back.  Patient is able to make his own decisions, however he asked for CSW to speak to his son Shanon Brow to discuss SNF placement process and options.  CSW spoke with patient's son who was at bedside, explained the process of SNF placement and what to expect.  Patient's son was explained about how insurance pays for the stay, and what the role of social worker is in order to find SNF placement.  Questions were answered by CSW, and family expressed they did not have any other questions.  Employment status:   Retired Health visitor, Managed Care PT Recommendations:  [place] / Referral to community resources:  [place]  Patient/Family's Response to care:  Patient and family agreeable to SNF placement for short term rehab.  Patient/Family's Understanding of and Emotional Response to Diagnosis, Current Treatment, and Prognosis:  Patient and son aware of diagnosis and current treatment plan.  Emotional Assessment Appearance:  Appears stated age Attitude/Demeanor/Rapport:    Affect (typically observed):  Calm, Stable, Pleasant, Quiet Orientation:  Oriented to Self, Oriented to Place, Oriented to Situation, Oriented to  Time Alcohol / Substance use:  Not Applicable Psych involvement (Current and /or in the community):  No (Comment)  Discharge Needs  Concerns to be addressed:  Lack of Support Readmission within the last 30 days:  No Current discharge risk:  Physical Impairment Barriers to Discharge:  Continued Medical Work up   Anell Barr 04/29/2015, 8:19 AM

## 2015-04-29 NOTE — Clinical Social Work Note (Signed)
Patient has been faxed out to [facility] awaiting bed offers for patient, CSW to continue to follow patient's progress.  Jones Broom. [place], MSW, [place] 04/29/2015 6:10 PM

## 2015-04-29 NOTE — NC FL2 (Signed)
[place] LEVEL OF CARE SCREENING TOOL     IDENTIFICATION  Patient Name: Alexander Beck Birthdate: [birth year] Sex: male Admission Date (Current Location): 04/27/2015  [facility] and [place] Number: Herbalist and Address:  [facility], [place] [address] [place], [place] [address]      Provider Number: [id number]  Attending Physician Name and Address:  Caren Griffins, MD  Relative Name and Phone Number:  Shawnee Higham [phone]    Current Level of Care: Hospital Recommended Level of Care: [place] Prior Approval Number:    Date Approved/Denied:   PASRR Number: [id number]  Discharge Plan: SNF    Current Diagnoses: Patient Active Problem List   Diagnosis Date Noted  . Pressure ulcer 04/28/2015  . Community acquired pneumonia 04/27/2015  . Diarrhea 04/27/2015  . Generalized weakness 04/27/2015  . leg swelling 04/27/2015  . Cough 04/27/2015  . SOB (shortness of breath) 04/27/2015  . Left leg weakness 04/27/2015  . Aspiration pneumonia ([place]) 04/27/2015  . Protein-calorie malnutrition, moderate ([place]) 04/27/2015  . Hypertension   . Mantle cell lymphoma ([place])   . HLD (hyperlipidemia)     Orientation ACTIVITIES/SOCIAL BLADDER RESPIRATION    Self, Time, Situation, Place    Continent Normal  BEHAVIORAL SYMPTOMS/MOOD NEUROLOGICAL BOWEL NUTRITION STATUS      Continent Diet (Normal)  PHYSICIAN VISITS COMMUNICATION OF NEEDS Height & Weight Skin    Verbally 5\' 11"  (180.3 cm) 160 lbs. PU Stage and Appropriate Care, Surgical wounds   PU Stage 2 Dressing: Daily      AMBULATORY STATUS RESPIRATION    Supervision limited Normal      Personal Care Assistance Level of Assistance  Bathing, Dressing Bathing Assistance: Limited assistance   Dressing Assistance: Limited assistance      Functional Limitations Info                SPECIAL CARE FACTORS FREQUENCY                      Additional  Factors Info  Code Status Code Status Info: Full Code             Current Medications (04/29/2015): Current Facility-Administered Medications  Medication Dose Route Frequency Provider Last Rate Last Dose  . albuterol (PROVENTIL) (2.5 MG/3ML) 0.083% nebulizer solution 2.5 mg  2.5 mg Nebulization Q4H PRN Ivor Costa, MD      . aspirin tablet 325 mg  325 mg Oral Daily Ivor Costa, MD   325 mg at 04/29/15 1009  . atenolol (TENORMIN) tablet 50 mg  50 mg Oral Daily Ivor Costa, MD   50 mg at 04/29/15 1009  . atropine 1 % ophthalmic solution 1 drop  1 drop Left Eye Daily Costin Karlyne Greenspan, MD   1 drop at 04/29/15 1010  . brimonidine (ALPHAGAN) 0.2 % ophthalmic solution 1 drop  1 drop Right Eye BID Caren Griffins, MD   1 drop at 04/29/15 1011  . dextromethorphan-guaiFENesin (MUCINEX DM) 30-600 MG per 12 hr tablet 1 tablet  1 tablet Oral BID Ivor Costa, MD   1 tablet at 04/29/15 1009  . ezetimibe (ZETIA) tablet 10 mg  10 mg Oral Daily Ivor Costa, MD   10 mg at 04/29/15 1009  . feeding supplement (ENSURE ENLIVE) (ENSURE ENLIVE) liquid 237 mL  237 mL Oral BID BM Ivor Costa, MD   237 mL at 04/29/15 1021  . heparin injection 5,000 Units  5,000 Units Subcutaneous  3 times per day Ivor Costa, MD   5,000 Units at 04/29/15 1511  . ondansetron (ZOFRAN) injection 4 mg  4 mg Intravenous Q8H PRN Ivor Costa, MD      . pantoprazole (PROTONIX) EC tablet 40 mg  40 mg Oral Daily Ivor Costa, MD   40 mg at 04/29/15 1009  . piperacillin-tazobactam (ZOSYN) IVPB 3.375 g  3.375 g Intravenous 3 times per day Skeet Simmer, RPH   3.375 g at 04/29/15 1651  . prednisoLONE acetate (PRED FORTE) 1 % ophthalmic suspension 1 drop  1 drop Left Eye Daily Costin Karlyne Greenspan, MD   1 drop at 04/29/15 1159  . senna-docusate (Senokot-S) tablet 1 tablet  1 tablet Oral QHS PRN Ivor Costa, MD      . terazosin (HYTRIN) capsule 10 mg  10 mg Oral QHS Ivor Costa, MD   10 mg at 04/27/15 2259  . timolol (TIMOPTIC) 0.5 % ophthalmic solution 1 drop  1 drop Right  Eye BID Caren Griffins, MD   1 drop at 04/29/15 1159  . vancomycin (VANCOCIN) IVPB 750 mg/150 ml premix  750 mg Intravenous Q12H Skeet Simmer, RPH   750 mg at 04/29/15 1505   Do not use this list as official medication orders. Please verify with discharge summary.  Discharge Medications:   Medication List    ASK your doctor about these medications        aspirin EC 81 MG tablet  Take 81 mg by mouth daily.     atenolol 50 MG tablet  Commonly known as:  TENORMIN  Take 50 mg by mouth 2 (two) times daily.     atropine 1 % ophthalmic solution  Place 1 drop into the left eye daily.     CALCIUM PO  Take 1 tablet by mouth daily.     COMBIGAN 0.2-0.5 % ophthalmic solution  Generic drug:  brimonidine-timolol  Place 1 drop into the right eye 2 (two) times daily.     ezetimibe 10 MG tablet  Commonly known as:  ZETIA  Take 10 mg by mouth daily.     hydrALAZINE 25 MG tablet  Commonly known as:  APRESOLINE  Take 25 mg by mouth 3 (three) times daily.     multivitamin with minerals Tabs tablet  Take 1 tablet by mouth daily.     omeprazole 20 MG capsule  Commonly known as:  PRILOSEC  Take 20 mg by mouth daily.     prednisoLONE acetate 1 % ophthalmic suspension  Commonly known as:  PRED FORTE  Place 1 drop into the left eye daily.     terazosin 10 MG capsule  Commonly known as:  HYTRIN  Take 10 mg by mouth at bedtime.        Relevant Imaging Results:  Relevant Lab Results:  Recent Labs    Additional Information No Known Allergies  Francia Verry, Jones Broom, LCSWA

## 2015-04-30 LAB — BASIC METABOLIC PANEL
Anion gap: 14 (ref 5–15)
BUN: 23 mg/dL — AB (ref 6–20)
CHLORIDE: 106 mmol/L (ref 101–111)
CO2: 19 mmol/L — AB (ref 22–32)
Calcium: 8.2 mg/dL — ABNORMAL LOW (ref 8.9–10.3)
Creatinine, Ser: 1.2 mg/dL (ref 0.61–1.24)
GFR calc Af Amer: >60 mL/min (ref >60)
GFR calc non Af Amer: 52 mL/min — ABNORMAL LOW (ref >60)
Glucose, Bld: 81 mg/dL (ref 65–99)
Potassium: 3.3 mmol/L — ABNORMAL LOW (ref 3.5–5.1)
Sodium: 139 mmol/L (ref 135–145)

## 2015-04-30 MED ORDER — LEVOFLOXACIN 500 MG PO TABS: 500.0000 mg | ORAL_TABLET | Freq: Every day | ORAL | Status: AC

## 2015-04-30 NOTE — Progress Notes (Signed)
EMS arrived to transport patient to [facility]. Rachael Fee, RN

## 2015-04-30 NOTE — Discharge Summary (Signed)
Physician Discharge Summary  Alexander Beck QBV:[id number] DOB: 07/26/1924 DOA: 04/27/2015  PCP: No primary care provider on file.  Admit date: 04/27/2015 Discharge date: 04/30/2015  Time spent: > 35 minutes  Recommendations for Outpatient Follow-up:  1. Follow up with Dr. Cruzita Lederer in 4 days as scheduled  2. Palliative care follow up at SNF 3. Levaquin for 5 additional days 4. Hydralazine was discontinued on discharge  Discharge Diagnoses:  Principal Problem:   Left leg weakness Active Problems:   Community acquired pneumonia   Hypertension   Mantle cell lymphoma ([place])   HLD (hyperlipidemia)   Diarrhea   Generalized weakness   leg swelling   Cough   SOB (shortness of breath)   Aspiration pneumonia (HCC)   Protein-calorie malnutrition, moderate (HCC)   Pressure ulcer  Discharge Condition: stable  Diet recommendation: regular  Filed Weights   04/27/15 1257  Weight: 72.576 kg (160 lb)    History of present illness:  See H&P, Labs, Consult and Test reports for all details in brief, patient is a 81 y.o. male with PMH of mantle cell lymphoma, hypertension, hyperlipidemia, GERD, left eye glaucoma (nearlly blinded), who presents with generalized weakness, diarrhea, slurred speech, R leg weakness, bilateral leg edema  Hospital Course:  Mantle cell lymphoma ([place]) - has been followed up in [place], currently under observation. I discussed with patient's oncologist Dr. Cruzita Lederer regarding patient's clinical condition and have done a staging CT of the chest abdomen pelvis. This unfortunately showed significant disease progression which likely accounts for his symptoms / weakness. Per his oncologist, patient is not a good chemotherapy candidate and has been recommending palliative consult given progression and patient's declining functional status. Palliative care has been consulted. Patient and family wish to see Dr. Cruzita Lederer and discuss options with him and an appointment was set up in  ~5 days as an outpatient.  Left leg weakness and slurred speech - MRI negative for CVA, weakness improved, PT evaluated patient and recommended SNF.  Productive cough and shortness of breath due to likely CAP- Chest x-ray showed possible infiltration in the right lower lobe. CT scan with pleural effusion, likely malignant. Patient's respiratory status is stable, he is comfortable on room air. He was started empirically on Vancomycin and Zosyn, with improvement in his respiratory status, and his antibiotics were narrowed to Levaquin and will need 5 additional days. Influenza negative.  HTN - Continue atenolol, also on Tetrazosin, d/c hydralazine due to LE swelling. Blood pressure controlled without hydralazine, will stop on discharge/  HLD - continue home medications Diarrhea  No recent antibiotic use, C diff PCR negative Protein-calorie malnutrition, moderate (HCC) - Ensure Bilateral leg swelling  - Etiology is not clear. Renal function and liver function are okay. Potential differential diagnosis include hypoalbuminemia secondary to protein calorie more nutrition, 2D echo with normal EF. Compression stockings.  Generalized weakness: - Likely multifactorial, including deconditioning, diarrhea, possible stroke, pneumonia, protein calorie malnutrition, progressive malignancy.   Procedures:  2D echo   Consultations:  Palliative care  Discharge Exam: Filed Vitals:   04/29/15 1007 04/29/15 2123 04/30/15 0002 04/30/15 0533  BP: 100/53 103/65  100/60  Pulse: 59 69 69 61  Temp:  98.2 F (36.8 C)  98.7 F (37.1 C)  TempSrc:  Oral    Resp:  15  17  Height:      Weight:      SpO2:  95% 96% 92%   General: NAD Cardiovascular: RRR Respiratory: CTA biL  Discharge Instructions Activity:  As  tolerated   Get Medicines reviewed and adjusted: Please take all your medications with you for your next visit with your Primary MD  Please request your Primary MD to go over all hospital tests and  procedure/radiological results at the follow up, please ask your Primary MD to get all Hospital records sent to his/her office.  If you experience worsening of your admission symptoms, develop shortness of breath, life threatening emergency, suicidal or homicidal thoughts you must seek medical attention immediately by calling 911 or calling your MD immediately if symptoms less severe.  You must read complete instructions/literature along with all the possible adverse reactions/side effects for all the Medicines you take and that have been prescribed to you. Take any new Medicines after you have completely understood and accpet all the possible adverse reactions/side effects.   Do not drive when taking Pain medications.   Do not take more than prescribed Pain, Sleep and Anxiety Medications  Special Instructions: If you have smoked or chewed Tobacco in the last 2 yrs please stop smoking, stop any regular Alcohol and or any Recreational drug use.  Wear Seat belts while driving.  Please note  You were cared for by a hospitalist during your hospital stay. Once you are discharged, your primary care physician will handle any further medical issues. Please note that NO REFILLS for any discharge medications will be authorized once you are discharged, as it is imperative that you return to your primary care physician (or establish a relationship with a primary care physician if you do not have one) for your aftercare needs so that they can reassess your need for medications and monitor your lab values.    Medication List    STOP taking these medications        hydrALAZINE 25 MG tablet  Commonly known as:  APRESOLINE      TAKE these medications        aspirin EC 81 MG tablet  Take 81 mg by mouth daily.     atenolol 50 MG tablet  Commonly known as:  TENORMIN  Take 50 mg by mouth 2 (two) times daily.     atropine 1 % ophthalmic solution  Place 1 drop into the left eye daily.     CALCIUM  PO  Take 1 tablet by mouth daily.     COMBIGAN 0.2-0.5 % ophthalmic solution  Generic drug:  brimonidine-timolol  Place 1 drop into the right eye 2 (two) times daily.     ezetimibe 10 MG tablet  Commonly known as:  ZETIA  Take 10 mg by mouth daily.     levofloxacin 500 MG tablet  Commonly known as:  LEVAQUIN  Take 1 tablet (500 mg total) by mouth daily. For 5 additional days     multivitamin with minerals Tabs tablet  Take 1 tablet by mouth daily.     omeprazole 20 MG capsule  Commonly known as:  PRILOSEC  Take 20 mg by mouth daily.     prednisoLONE acetate 1 % ophthalmic suspension  Commonly known as:  PRED FORTE  Place 1 drop into the left eye daily.     terazosin 10 MG capsule  Commonly known as:  HYTRIN  Take 10 mg by mouth at bedtime.           Follow-up Information    Follow up with HARISH,V C, MD In 4 days.   Specialty:  Hematology and Oncology   Why:  as scheduled      The  results of significant diagnostics from this hospitalization (including imaging, microbiology, ancillary and laboratory) are listed below for reference.    Significant Diagnostic Studies: Dg Chest 2 View  04/27/2015  CLINICAL DATA:  Cough, bilateral lower extremity edema, weakness. Symptoms for a few days. EXAM: CHEST  2 VIEW COMPARISON:  10/13/2014 FINDINGS: Small to moderate right pleural effusion with right lower atelectasis or infiltrate. No confluent opacity on the left. Heart is normal size. No acute bony abnormality. IMPRESSION: Small to moderate right pleural effusion with right lower lobe atelectasis or infiltrate. Electronically Signed   By: Rolm Baptise M.D.   On: 04/27/2015 15:32   Ct Head Wo Contrast  04/27/2015  CLINICAL DATA:  Left-sided weakness today. EXAM: CT HEAD WITHOUT CONTRAST TECHNIQUE: Contiguous axial images were obtained from the base of the skull through the vertex without intravenous contrast. COMPARISON:  10/13/2014 FINDINGS: Mild cerebral atrophy. Patchy  low-attenuation changes in the deep white matter consistent with small vessel ischemia. No ventricular dilatation. No mass effect or midline shift. No abnormal extra-axial fluid collections. Gray-white matter junctions are distinct. Basal cisterns are not effaced. No evidence of acute intracranial hemorrhage. No depressed skull fractures. Visualized paranasal sinuses and mastoid air cells are not opacified. Postoperative changes in the orbits. Vascular calcifications. IMPRESSION: No acute intracranial abnormalities. Mild chronic atrophy and small vessel ischemic changes. Electronically Signed   By: Lucienne Capers M.D.   On: 04/27/2015 21:54   Ct Chest W Contrast  04/28/2015  CLINICAL DATA:  81 year old male inpatient with a reported history of mantle cell lymphoma. Restaging. EXAM: CT CHEST, ABDOMEN, AND PELVIS WITH CONTRAST TECHNIQUE: Multidetector CT imaging of the chest, abdomen and pelvis was performed following the standard protocol during bolus administration of intravenous contrast. CONTRAST:  157mL OMNIPAQUE IOHEXOL 300 MG/ML  SOLN COMPARISON:  05/29/2014 PET-CT.  09/27/2011 CT abdomen/pelvis. FINDINGS: CT CHEST FINDINGS Mediastinum/Nodes: Normal heart size. No pericardial fluid/thickening. There is atherosclerosis of the thoracic aorta, the great vessels of the mediastinum and the coronary arteries, including calcified atherosclerotic plaque in the left anterior descending, left circumflex and right coronary arteries. Great vessels are normal in course and caliber. No central pulmonary emboli. Normal visualized thyroid. Mildly patulous and otherwise grossly normal esophagus. There is moderate bilateral axillary lymphadenopathy, with a dominant 2.0 cm enlarged right axillary node (series 201/ image 22), increased from 1.1 cm on 05/29/2014. An enlarged 1.7 cm left axillary node (201/ image 21) is increased from 0.9 cm. Anterior mediastinal 1.1 cm mass (201/20) is increased from 0.8 cm, either thymic  hyperplasia or anterior mediastinal lymphadenopathy. Moderate mediastinal lymphadenopathy in the paratracheal and subcarinal territories has increased. For example an enlarged 1.6 cm right paratracheal node (201/15) is increased from 0.6 cm. A 1.9 cm subcarinal node (201/31) is increased from 0.7 cm. Mild-to-moderate bilateral hilar lymphadenopathy is increased bilaterally. Coarsely calcified nodes from prior granulomatous disease are again noted in the left hilum. Lungs/Pleura: No pneumothorax. New moderate right pleural effusion with mild posterior right pleural thickening up to 7 mm thickness. No left pleural effusion. There is new peribronchovascular interstitial thickening and nodularity in the right greater than left lungs. A subpleural 1.0 cm anterior left lower lobe pulmonary nodule (203/35) is increased from 0.6 cm. There is moderate passive atelectasis in the dependent right lung. Musculoskeletal: No aggressive appearing focal osseous lesions. Severe right glenohumeral joint osteoarthritis. Moderate degenerative changes in the thoracic spine. There is anasarca. CT ABDOMEN PELVIS FINDINGS Hepatobiliary: Normal liver with no liver mass. Normal gallbladder with no radiopaque  cholelithiasis. No biliary ductal dilatation. Pancreas: Normal, with no mass or duct dilation. Spleen: Normal size. No mass. Adrenals/Urinary Tract: Normal adrenals. Three subcentimeter hypodense lesions in the right kidney are too small to characterize. No hydronephrosis. Normal bladder. Stomach/Bowel: Grossly normal stomach. Normal caliber small bowel with no small bowel wall thickening. Appendix is not discretely visualized. There is diffuse wall thickening of the entire colon and rectum. Vascular/Lymphatic: Atherosclerotic nonaneurysmal abdominal aorta. Patent portal, splenic, hepatic and renal veins. There is new bulky mesenteric lymphadenopathy, for example a 3.4 cm central mesenteric node (201/79) . There is new bilateral mild  inguinal lymphadenopathy. Reproductive: Stable moderate prostatomegaly. Other: No pneumoperitoneum, ascites or focal fluid collection. Musculoskeletal: No aggressive appearing focal osseous lesions. Stable Paget's disease of the right proximal femur. Severe degenerative changes in the lumbar spine. Anasarca. IMPRESSION: 1. Interval progression of bilateral axillary, mediastinal, bilateral hilar, mesenteric and bilateral inguinal lymphadenopathy, in keeping with progressive lymphoma. 2. New peribronchovascular interstitial thickening and nodularity involving the right greater than left lungs. Enlarging left lower lobe subpleural pulmonary nodule. 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 3. New moderate right pleural effusion with mild pleural thickening, suggesting a malignant effusion. 4. Anasarca. Diffuse colorectal wall thickening, likely due to non inflammatory edema such as due to hypoalbuminemia given the anasarca. 5. Atherosclerosis, including three-vessel coronary artery disease. Please note that although the presence of coronary artery calcium documents the presence of coronary artery disease, the severity of this disease and any potential stenosis cannot be assessed on this non-gated CT examination. Electronically Signed   By: Ilona Sorrel M.D.   On: 04/28/2015 18:56   Mr Brain Wo Contrast  04/27/2015  CLINICAL DATA:  Generalized weakness, diarrhea, slurred speech, RIGHT leg weakness and bilateral leg edema beginning 5 days ago. History of lymphoma, hypertension, hyperlipidemia, LEFT eye blindness. EXAM: MRI HEAD WITHOUT CONTRAST MRA HEAD WITHOUT CONTRAST TECHNIQUE: Multiplanar, multiecho pulse sequences of the brain and surrounding structures were obtained without intravenous contrast. Angiographic images of the head were obtained using MRA technique without contrast. COMPARISON:  CT head April 27, 2015 at 2132 hours FINDINGS: MRI HEAD FINDINGS Punctate focus of reduced  diffusion LEFT pons on axial 13/94 not corroborated on coronal T2 view likely artifact. Ventricles and sulci are normal for patient's age. No susceptibility artifact to suggest blood products. Patchy to confluent supratentorial white matter FLAIR T2 hyperintense signal without midline shift, mass effect or mass lesions. Old small RIGHT basal ganglia lacunar infarcts. No abnormal extra-axial fluid collections. Status post bilateral ocular lens implants. Old LEFT medial orbital blowout fracture. Imaged paranasal sinuses are well aerated. Small RIGHT mastoid effusion. Elongated sella in AP dimension, and narrowed in transaxial dimension. No cerebellar tonsillar ectopia. No suspicious calvarial bone marrow signal. MRA HEAD FINDINGS Anterior Circulation: Dolicoectatic internal carotid arteries. Mild luminal irregularity of the LEFT carotid siphon corresponding to dense calcifications on the prior CT head. Bilateral internal carotid arteries are widely patent. Normal flow related enhancement bilateral anterior cerebral arteries, tiny anterior communicating artery present. Normal flow related enhancement of bilateral middle cerebral arteries, including more distal segments. Posterior circulation: LEFT vertebral artery is dominant. Widely patent bilateral vertebral arteries, patent basilar artery though motion somewhat limits evaluation. Robust bilateral posterior communicating arteries contribute to the posterior circulation. Bilateral posterior cerebral arteries are widely patent. IMPRESSION: MRI HEAD: No convincing evidence of acute ischemia nor acute intracranial process. Involutional changes. Moderate chronic small vessel ischemic disease. Old RIGHT basal ganglia lacunar infarcts. MRA HEAD: No acute large vessel occlusion  or high-grade stenosis. Dolichoectasia compatible chronic hypertension. Electronically Signed   By: Elon Alas M.D.   On: 04/27/2015 22:52   Ct Abdomen Pelvis W Contrast  04/28/2015   CLINICAL DATA:  81 year old male inpatient with a reported history of mantle cell lymphoma. Restaging. EXAM: CT CHEST, ABDOMEN, AND PELVIS WITH CONTRAST TECHNIQUE: Multidetector CT imaging of the chest, abdomen and pelvis was performed following the standard protocol during bolus administration of intravenous contrast. CONTRAST:  155mL OMNIPAQUE IOHEXOL 300 MG/ML  SOLN COMPARISON:  05/29/2014 PET-CT.  09/27/2011 CT abdomen/pelvis. FINDINGS: CT CHEST FINDINGS Mediastinum/Nodes: Normal heart size. No pericardial fluid/thickening. There is atherosclerosis of the thoracic aorta, the great vessels of the mediastinum and the coronary arteries, including calcified atherosclerotic plaque in the left anterior descending, left circumflex and right coronary arteries. Great vessels are normal in course and caliber. No central pulmonary emboli. Normal visualized thyroid. Mildly patulous and otherwise grossly normal esophagus. There is moderate bilateral axillary lymphadenopathy, with a dominant 2.0 cm enlarged right axillary node (series 201/ image 22), increased from 1.1 cm on 05/29/2014. An enlarged 1.7 cm left axillary node (201/ image 21) is increased from 0.9 cm. Anterior mediastinal 1.1 cm mass (201/20) is increased from 0.8 cm, either thymic hyperplasia or anterior mediastinal lymphadenopathy. Moderate mediastinal lymphadenopathy in the paratracheal and subcarinal territories has increased. For example an enlarged 1.6 cm right paratracheal node (201/15) is increased from 0.6 cm. A 1.9 cm subcarinal node (201/31) is increased from 0.7 cm. Mild-to-moderate bilateral hilar lymphadenopathy is increased bilaterally. Coarsely calcified nodes from prior granulomatous disease are again noted in the left hilum. Lungs/Pleura: No pneumothorax. New moderate right pleural effusion with mild posterior right pleural thickening up to 7 mm thickness. No left pleural effusion. There is new peribronchovascular interstitial thickening and  nodularity in the right greater than left lungs. A subpleural 1.0 cm anterior left lower lobe pulmonary nodule (203/35) is increased from 0.6 cm. There is moderate passive atelectasis in the dependent right lung. Musculoskeletal: No aggressive appearing focal osseous lesions. Severe right glenohumeral joint osteoarthritis. Moderate degenerative changes in the thoracic spine. There is anasarca. CT ABDOMEN PELVIS FINDINGS Hepatobiliary: Normal liver with no liver mass. Normal gallbladder with no radiopaque cholelithiasis. No biliary ductal dilatation. Pancreas: Normal, with no mass or duct dilation. Spleen: Normal size. No mass. Adrenals/Urinary Tract: Normal adrenals. Three subcentimeter hypodense lesions in the right kidney are too small to characterize. No hydronephrosis. Normal bladder. Stomach/Bowel: Grossly normal stomach. Normal caliber small bowel with no small bowel wall thickening. Appendix is not discretely visualized. There is diffuse wall thickening of the entire colon and rectum. Vascular/Lymphatic: Atherosclerotic nonaneurysmal abdominal aorta. Patent portal, splenic, hepatic and renal veins. There is new bulky mesenteric lymphadenopathy, for example a 3.4 cm central mesenteric node (201/79) . There is new bilateral mild inguinal lymphadenopathy. Reproductive: Stable moderate prostatomegaly. Other: No pneumoperitoneum, ascites or focal fluid collection. Musculoskeletal: No aggressive appearing focal osseous lesions. Stable Paget's disease of the right proximal femur. Severe degenerative changes in the lumbar spine. Anasarca. IMPRESSION: 1. Interval progression of bilateral axillary, mediastinal, bilateral hilar, mesenteric and bilateral inguinal lymphadenopathy, in keeping with progressive lymphoma. 2. New peribronchovascular interstitial thickening and nodularity involving the right greater than left lungs. Enlarging left lower lobe subpleural pulmonary nodule. These findings likely represent  progressive pulmonary lymphoma. 3. New moderate right pleural effusion with mild pleural thickening, suggesting a malignant effusion. 4. Anasarca. Diffuse colorectal wall thickening, likely due to non inflammatory edema such as due to hypoalbuminemia given the anasarca.  5. Atherosclerosis, including three-vessel coronary artery disease. Please note that although the presence of coronary artery calcium documents the presence of coronary artery disease, the severity of this disease and any potential stenosis cannot be assessed on this non-gated CT examination. Electronically Signed   By: Ilona Sorrel M.D.   On: 04/28/2015 18:56   Mr Jodene Nam Head/brain Wo Cm  04/27/2015  CLINICAL DATA:  Generalized weakness, diarrhea, slurred speech, RIGHT leg weakness and bilateral leg edema beginning 5 days ago. History of lymphoma, hypertension, hyperlipidemia, LEFT eye blindness. EXAM: MRI HEAD WITHOUT CONTRAST MRA HEAD WITHOUT CONTRAST TECHNIQUE: Multiplanar, multiecho pulse sequences of the brain and surrounding structures were obtained without intravenous contrast. Angiographic images of the head were obtained using MRA technique without contrast. COMPARISON:  CT head April 27, 2015 at 2132 hours FINDINGS: MRI HEAD FINDINGS Punctate focus of reduced diffusion LEFT pons on axial 13/94 not corroborated on coronal T2 view likely artifact. Ventricles and sulci are normal for patient's age. No susceptibility artifact to suggest blood products. Patchy to confluent supratentorial white matter FLAIR T2 hyperintense signal without midline shift, mass effect or mass lesions. Old small RIGHT basal ganglia lacunar infarcts. No abnormal extra-axial fluid collections. Status post bilateral ocular lens implants. Old LEFT medial orbital blowout fracture. Imaged paranasal sinuses are well aerated. Small RIGHT mastoid effusion. Elongated sella in AP dimension, and narrowed in transaxial dimension. No cerebellar tonsillar ectopia. No suspicious  calvarial bone marrow signal. MRA HEAD FINDINGS Anterior Circulation: Dolicoectatic internal carotid arteries. Mild luminal irregularity of the LEFT carotid siphon corresponding to dense calcifications on the prior CT head. Bilateral internal carotid arteries are widely patent. Normal flow related enhancement bilateral anterior cerebral arteries, tiny anterior communicating artery present. Normal flow related enhancement of bilateral middle cerebral arteries, including more distal segments. Posterior circulation: LEFT vertebral artery is dominant. Widely patent bilateral vertebral arteries, patent basilar artery though motion somewhat limits evaluation. Robust bilateral posterior communicating arteries contribute to the posterior circulation. Bilateral posterior cerebral arteries are widely patent. IMPRESSION: MRI HEAD: No convincing evidence of acute ischemia nor acute intracranial process. Involutional changes. Moderate chronic small vessel ischemic disease. Old RIGHT basal ganglia lacunar infarcts. MRA HEAD: No acute large vessel occlusion or high-grade stenosis. Dolichoectasia compatible chronic hypertension. Electronically Signed   By: Elon Alas M.D.   On: 04/27/2015 22:52    Microbiology: Recent Results (from the past 240 hour(s))  Blood culture (routine x 2)     Status: None (Preliminary result)   Collection Time: 04/27/15  4:49 PM  Result Value Ref Range Status   Specimen Description BLOOD RIGHT HAND  Final   Special Requests BOTTLES DRAWN AEROBIC AND ANAEROBIC 4CC EACH  Final   Culture   Final    NO GROWTH 2 DAYS Performed at [facility]    Report Status PENDING  Incomplete  Blood culture (routine x 2)     Status: None (Preliminary result)   Collection Time: 04/27/15  4:49 PM  Result Value Ref Range Status   Specimen Description BLOOD LEFT AC  Final   Special Requests BOTTLES DRAWN AEROBIC AND ANAEROBIC 5CC EACH  Final   Culture   Final    NO GROWTH 2 DAYS Performed at  [facility]    Report Status PENDING  Incomplete  Culture, sputum-assessment     Status: None   Collection Time: 04/27/15  8:53 PM  Result Value Ref Range Status   Specimen Description SPUTUM  Final   Special Requests NONE  Final   Sputum evaluation   Final    MICROSCOPIC FINDINGS SUGGEST THAT THIS SPECIMEN IS NOT REPRESENTATIVE OF LOWER RESPIRATORY SECRETIONS. PLEASE RECOLLECT. Gram Stain Report Called to,Read Back By and Verified With: JULIAN @0054  04/28/15 MKELLY    Report Status 04/28/2015 FINAL  Final  Urine culture     Status: None   Collection Time: 04/28/15 12:18 AM  Result Value Ref Range Status   Specimen Description URINE, CLEAN CATCH  Final   Special Requests NONE  Final   Culture MULTIPLE SPECIES PRESENT, SUGGEST RECOLLECTION  Final   Report Status 04/29/2015 FINAL  Final  C difficile quick scan w PCR reflex     Status: None   Collection Time: 04/28/15 12:18 AM  Result Value Ref Range Status   C Diff antigen NEGATIVE NEGATIVE Final   C Diff toxin NEGATIVE NEGATIVE Final   C Diff interpretation Negative for toxigenic C. difficile  Final  Stool culture     Status: None (Preliminary result)   Collection Time: 04/28/15 12:18 AM  Result Value Ref Range Status   Specimen Description STOOL  Final   Special Requests NONE  Final   Culture   Final    Culture reincubated for better growth Performed at [facility]    Report Status PENDING  Incomplete  Culture, expectorated sputum-assessment     Status: None   Collection Time: 04/29/15 10:35 AM  Result Value Ref Range Status   Specimen Description EXPECTORATED SPUTUM  Final   Special Requests NONE  Final   Sputum evaluation   Final    THIS SPECIMEN IS ACCEPTABLE. RESPIRATORY CULTURE REPORT TO FOLLOW.   Report Status 04/29/2015 FINAL  Final  Culture, respiratory (NON-Expectorated)     Status: None (Preliminary result)   Collection Time: 04/29/15 10:35 AM  Result Value Ref Range Status   Specimen  Description SPUTUM  Final   Special Requests NONE  Final   Gram Stain   Final    ABUNDANT WBC PRESENT,BOTH PMN AND MONONUCLEAR NO SQUAMOUS EPITHELIAL CELLS SEEN NO ORGANISMS SEEN Performed at [facility]    Culture   Final    NO GROWTH 1 DAY Performed at [facility]    Report Status PENDING  Incomplete     Labs: Basic Metabolic Panel:  Recent Labs Lab 04/27/15 1420 04/29/15 0611 04/30/15 0504  NA 141 137 139  K 3.8 3.2* 3.3*  CL 110 107 106  CO2 24 22 19*  GLUCOSE 103* 72 81  BUN 19 21* 23*  CREATININE 0.90 1.16 1.20  CALCIUM 8.7* 8.3* 8.2*   Liver Function Tests:  Recent Labs Lab 04/27/15 1420  AST 40  ALT 25  ALKPHOS 90  BILITOT 0.6  PROT 6.0*  ALBUMIN 2.4*   CBC:  Recent Labs Lab 04/27/15 1420 04/29/15 0611  WBC 67.2* 62.0*  HGB 10.6* 10.1*  HCT 33.0* 32.0*  MCV 68.9* 71.6*  PLT 246 209   Cardiac Enzymes:  Recent Labs Lab 04/27/15 1420  TROPONINI <0.03   BNP: BNP (last 3 results)  Recent Labs  04/28/15 0531  BNP 243.3*    Signed:  Annebelle Bostic  [facility] 04/30/2015, 12:54 PM

## 2015-04-30 NOTE — Progress Notes (Signed)
  Jim Desanctis to be D/C'd to [facility] per MD order.  Discussed with the patient and all questions fully answered.  VSS, Skin clean, dry and intact except stage one on sacral area. IV catheter discontinued intact. Site without signs and symptoms of complications. Dressing and pressure applied.  D/c education completed with patient/family including follow up instructions, medication list, d/c activities limitations if indicated, with other d/c instructions as indicated by MD - patient able to verbalize understanding, all questions fully answered.   Patient escorted via EMS.  Rachael Fee, RN 04/30/2015 5:34 PM

## 2015-04-30 NOTE — Clinical Social Work Note (Signed)
CSW contacted patient's family to discuss bed offers, patient's family has chosen [facility], [place] confirmed with Kathleen Argue that they can take patient either today or tomorrow whenever he is medically ready for discharge.  CSW to continue to follow patient's progress.  Jones Broom. [place], MSW, [place] 04/30/2015 12:50 PM

## 2015-04-30 NOTE — Care Management Important Message (Signed)
Important Message  Patient Details  Name: Alexander Beck MRN: [id number] Date of Birth: 06-Jul-1924   Medicare Important Message Given:  Yes-second notification given    Nathen May 04/30/2015, 11:09 AM

## 2015-04-30 NOTE — Care Management Note (Signed)
Case Management Note  Patient Details  Name: [name] MRN: [id number] Date of Birth: 08/24/1924  Subjective/Objective:     Patient is for dc today to snf, Randall Hiss CSW is aware.               Action/Plan:   Expected Discharge Date:                  Expected Discharge Plan:  Skilled Nursing Facility  In-House Referral:  Clinical Social Work  Discharge planning Services  CM Consult  Post Acute Care Choice:    Choice offered to:     DME Arranged:    DME Agency:     HH Arranged:    [place] Agency:     Status of Service:  Completed, signed off  Medicare Important Message Given:  Yes-second notification given Date Medicare IM Given:    Medicare IM give by:    Date Additional Medicare IM Given:    Additional Medicare Important Message give by:     If discussed at [place] of Stay Meetings, dates discussed:    Additional Comments:  Zenon Mayo, RN 04/30/2015, 2:45 PM

## 2015-04-30 NOTE — Progress Notes (Signed)
Physical Therapy Treatment Patient Details Name: Alexander Beck MRN: [id number] DOB: Oct 15, 1924 Today's Date: 04/30/2015    History of Present Illness 81 y.o. male with PMH of mantle cell lymphoma, hypertension, hyperlipidemia, GERD, left eye glaucoma (nearlly blinded), who presents with generalized weakness, diarrhea, slurred speech, R leg weakness, bilateral leg edema. MRI No convincing evidence of acute ischemia. Pt also with PNA.    PT Comments    Pt making slow, steady progress.  Follow Up Recommendations  SNF     Equipment Recommendations  Rolling walker with 5" wheels    Recommendations for Other Services       Precautions / Restrictions Precautions Precautions: Fall Restrictions Weight Bearing Restrictions: No    Mobility  Bed Mobility               General bed mobility comments: Pt up in recliner  Transfers Overall transfer level: Needs assistance Equipment used: Rolling walker (2 wheeled);Ambulation equipment used Transfers: Sit to/from Stand Sit to Stand: +2 physical assistance;Mod assist         General transfer comment: Assist to bring hips up.  Ambulation/Gait Ambulation/Gait assistance: Min assist;+2 safety/equipment Ambulation Distance (Feet): 10 Feet Assistive device: Rolling walker (2 wheeled) Gait Pattern/deviations: Step-through pattern;Decreased step length - right;Decreased step length - left;Shuffle;Trunk flexed Gait velocity: slow Gait velocity interpretation: Below normal speed for age/gender General Gait Details: Verbal cues to stand more erect. Pt fatigues quickly and was followed with recliner   Stairs            Wheelchair Mobility    Modified Rankin (Stroke Patients Only) Modified Rankin (Stroke Patients Only) Pre-Morbid Rankin Score: Moderate disability Modified Rankin: Moderately severe disability     Balance Overall balance assessment: Needs assistance Sitting-balance support: Feet supported;No upper  extremity supported Sitting balance-Leahy Scale: Fair     Standing balance support: Bilateral upper extremity supported Standing balance-Leahy Scale: Poor Standing balance comment: walker and min A for static standing                    Cognition Arousal/Alertness: Awake/alert Behavior During Therapy: WFL for tasks assessed/performed Overall Cognitive Status: Within Functional Limits for tasks assessed                      Exercises      General Comments        Pertinent Vitals/Pain Pain Assessment: No/denies pain    Home Living                      Prior Function            PT Goals (current goals can now be found in the care plan section) Acute Rehab PT Goals Patient Stated Goal: Get better PT Goal Formulation: With patient/family Time For Goal Achievement: 05/12/15 Potential to Achieve Goals: Good Progress towards PT goals: Progressing toward goals    Frequency  Min 2X/week    PT Plan Current plan remains appropriate    Co-evaluation             End of Session Equipment Utilized During Treatment: Gait belt Activity Tolerance: Patient tolerated treatment well Patient left: in chair;with call bell/phone within reach;with family/visitor present     Time: 4268-3419 PT Time Calculation (min) (ACUTE ONLY): 18 min  Charges:  $Gait Training: 8-22 mins                    G Codes:  Tamelia Michalowski 04/30/2015, 11:52 AM Suanne Marker PT [phone]

## 2015-05-01 LAB — CULTURE, RESPIRATORY W GRAM STAIN

## 2015-05-01 LAB — CULTURE, RESPIRATORY: CULTURE: NORMAL

## 2015-05-02 LAB — CULTURE, BLOOD (ROUTINE X 2)
CULTURE: NO GROWTH
Culture: NO GROWTH

## 2015-05-02 LAB — STOOL CULTURE

## 2015-05-03 NOTE — Consult Note (Signed)
Patient for D/C to SNF today.  Attempt is  rehab for  Strengthening.  Patient to visit Onc. On Wednesday at [facility] to determine any further treatment.  Recommend Palliative Care to follow at SNF until further course of treatment is determined.  Information given to son at bedside.  Kizzie Fantasia, RN, MSN, [facility]

## 2015-07-28 DEATH — deceased

## 2016-12-14 IMAGING — CR DG CHEST 2V
2 series · 2 of 2 positions shown · non-contrast
Comparison: 10/13/2014

CLINICAL DATA: Cough, bilateral lower extremity edema, weakness.
Symptoms for a few days.

EXAM:
CHEST  2 VIEW

[w chest pa]
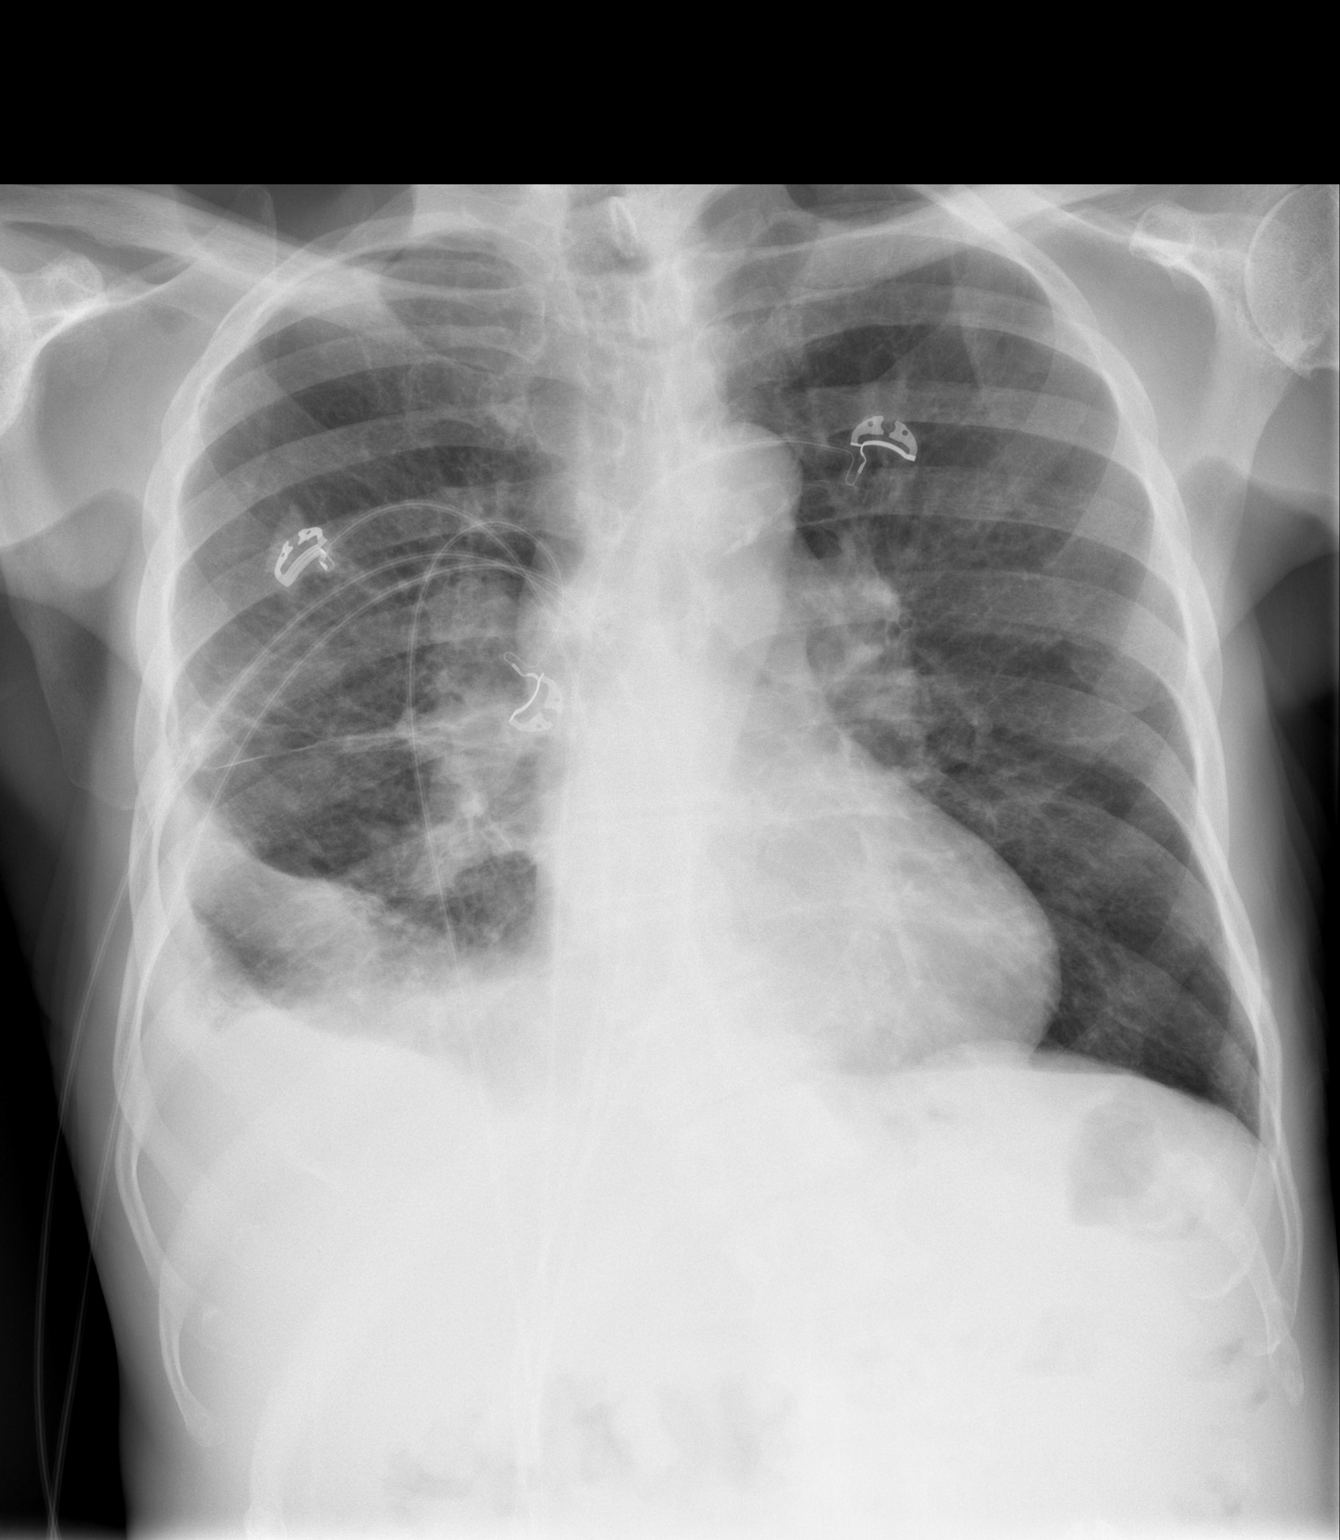

[w chest lat]
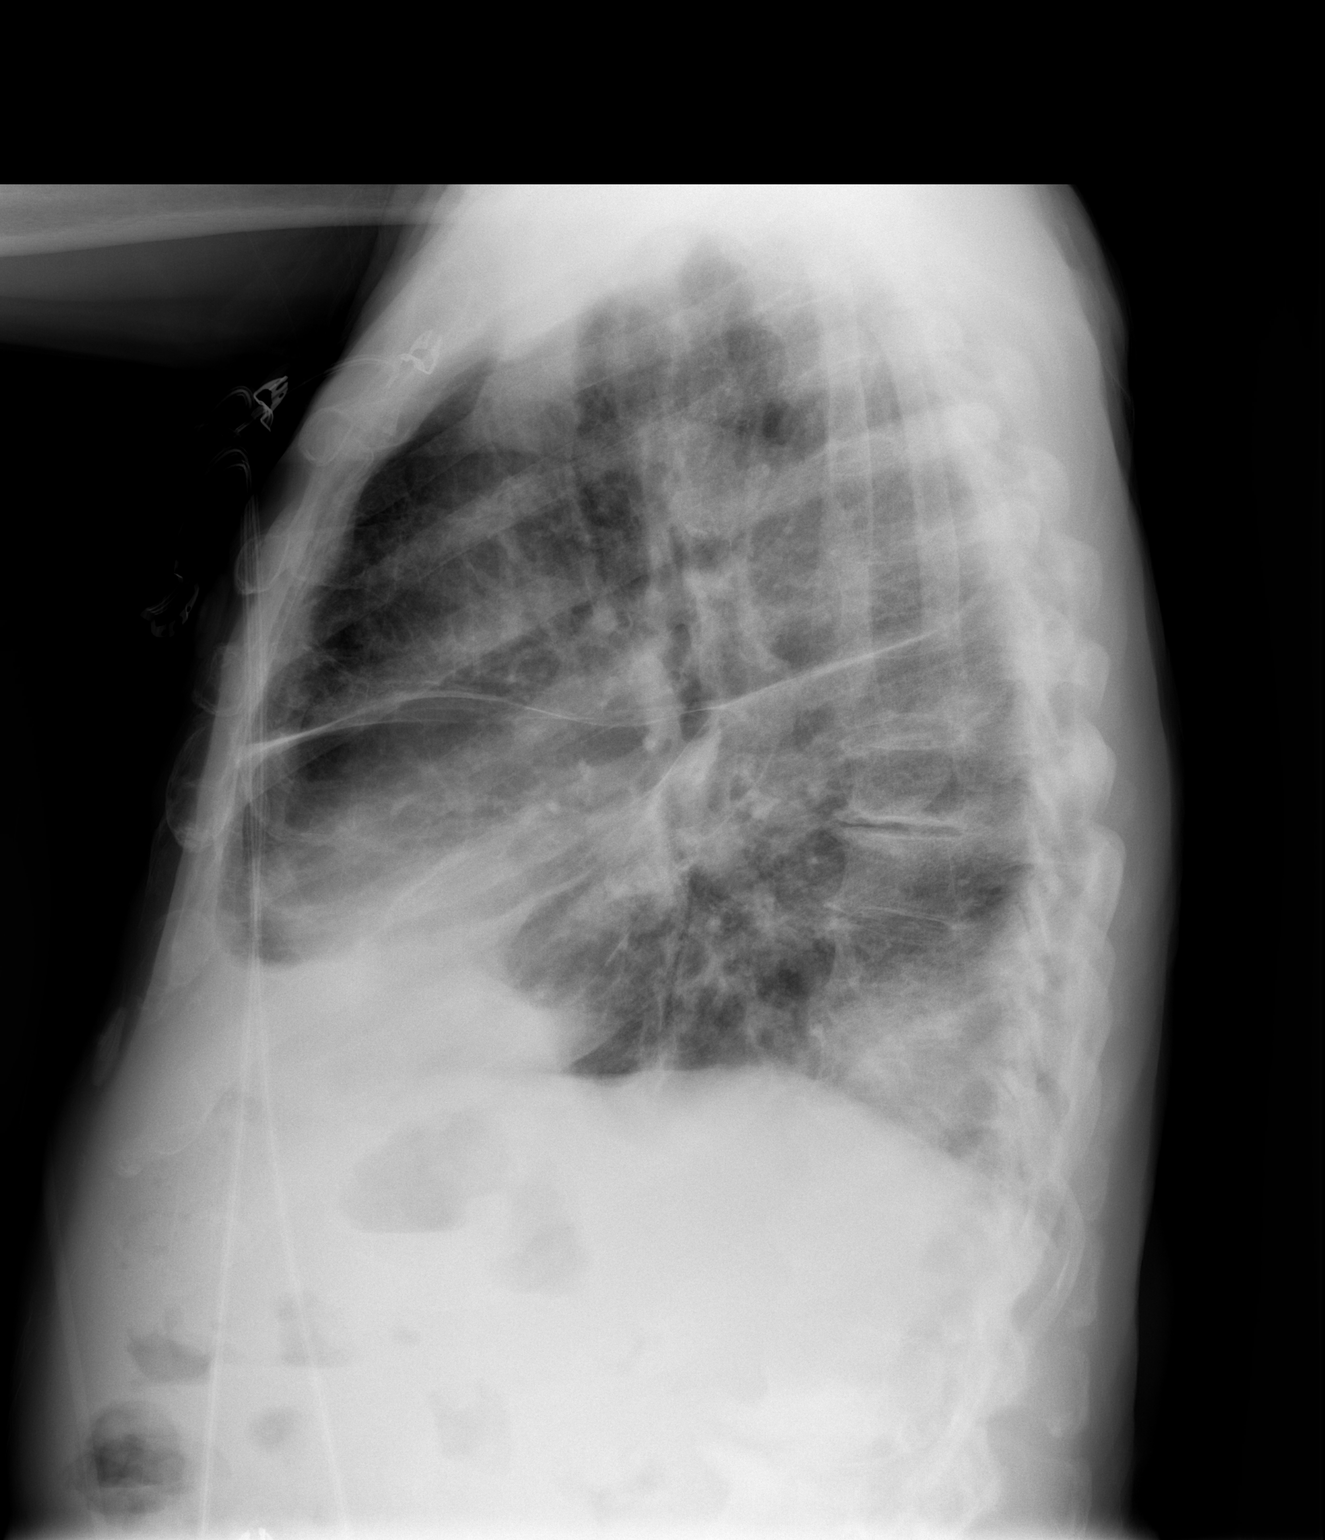

[2 of 2 positions shown; findings below may reference images not displayed]

FINDINGS: Small to moderate right pleural effusion with right lower
atelectasis or infiltrate. No confluent opacity on the left. Heart
is normal size. No acute bony abnormality.
IMPRESSION: Small to moderate right pleural effusion with right lower lobe
atelectasis or infiltrate.

## 2016-12-15 IMAGING — CT CT ABD-PELV W/ CM
1 of 5 series · 2 of 46 positions shown, 3 images · IV contrast (Iodine)
Comparison: 05/29/2014 PET-CT.  09/27/2011 CT abdomen/pelvis.

CLINICAL DATA: 82-year-old male inpatient with a reported history
of mantle cell lymphoma. Restaging.

EXAM:
CT CHEST, ABDOMEN, AND PELVIS WITH CONTRAST
TECHNIQUE: Multidetector CT imaging of the chest, abdomen and pelvis was
performed following the standard protocol during bolus
administration of intravenous contrast.
CONTRAST:  100mL OMNIPAQUE IOHEXOL 300 MG/ML  SOLN

[Series 205: cor · coronal · 0.45mm/px · 2 of 143 slices shown, 3 images]
[im 48/143  soft-tissue]
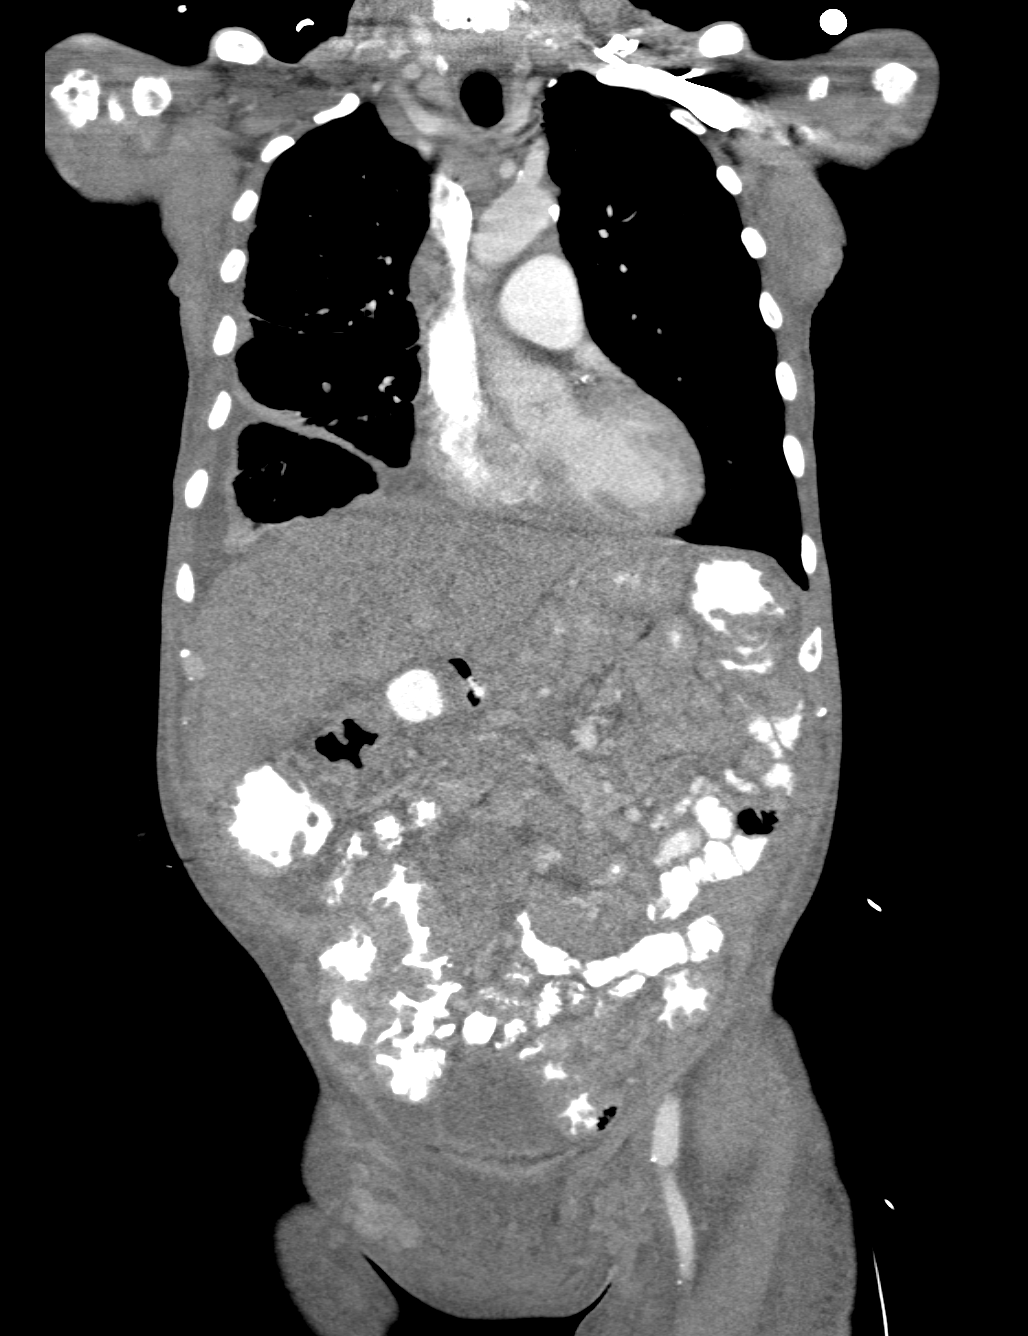
[im 48/143  bone]
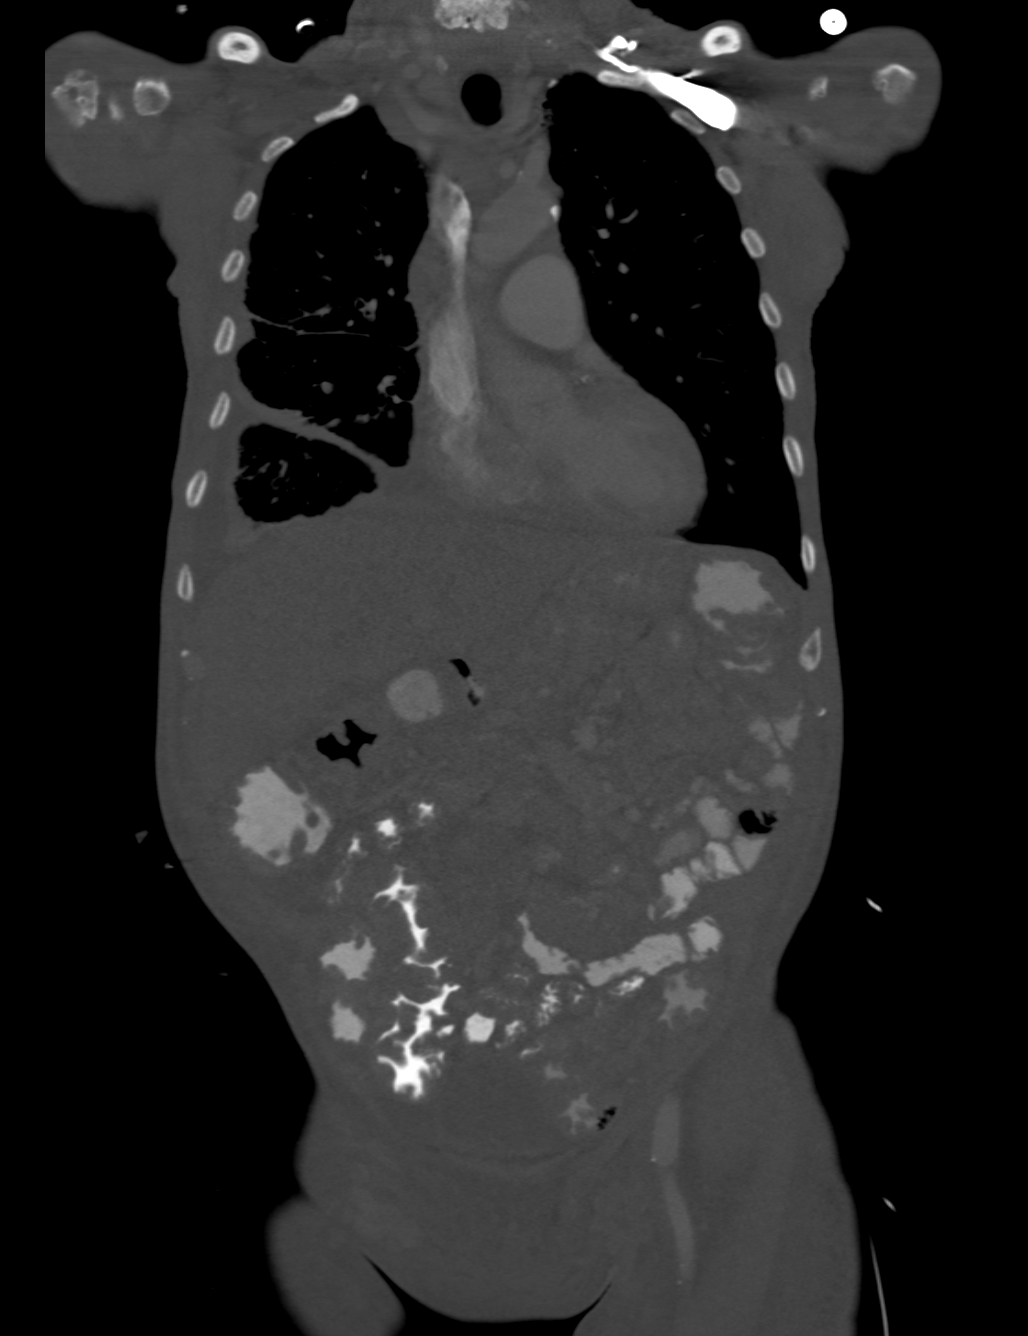
[im 111/143  soft-tissue]
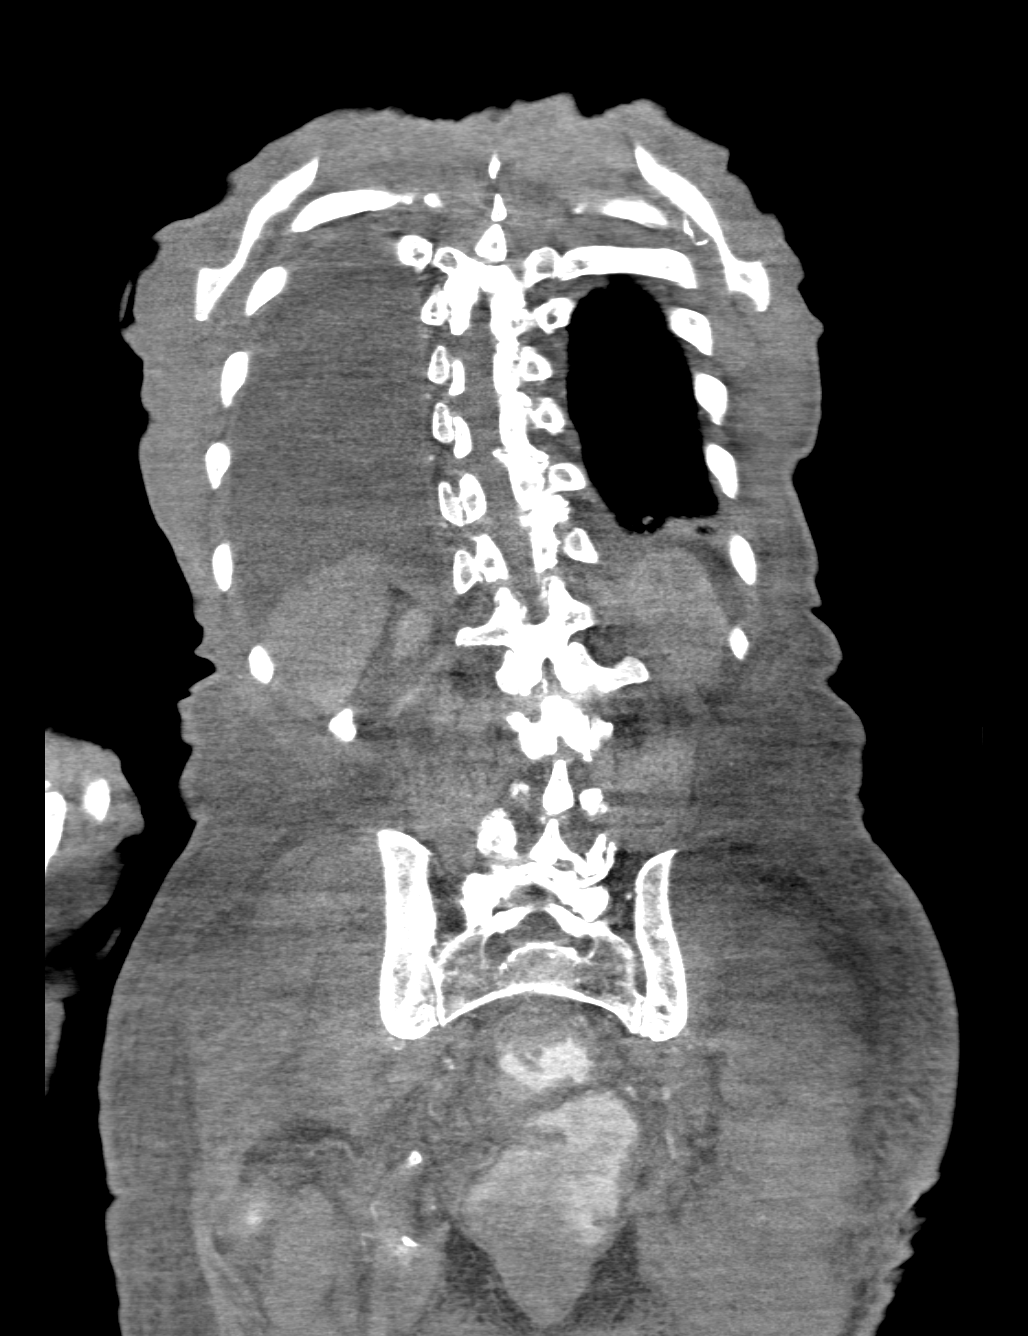

[2 of 46 positions shown; findings below may reference images not displayed]

FINDINGS: CT CHEST FINDINGS

Mediastinum/Nodes: Normal heart size. No pericardial
fluid/thickening. There is atherosclerosis of the thoracic aorta,
the great vessels of the mediastinum and the coronary arteries,
including calcified atherosclerotic plaque in the left anterior
descending, left circumflex and right coronary arteries. Great
vessels are normal in course and caliber. No central pulmonary
emboli. Normal visualized thyroid. Mildly patulous and otherwise
grossly normal esophagus. There is moderate bilateral axillary
lymphadenopathy, with a dominant 2.0 cm enlarged right axillary node
(series 201/ image 22), increased from 1.1 cm on 05/29/2014. An
enlarged 1.7 cm left axillary node (201/ image 21) is increased from
0.9 cm. Anterior mediastinal 1.1 cm mass (201/20) is increased from
0.8 cm, either thymic hyperplasia or anterior mediastinal
lymphadenopathy. Moderate mediastinal lymphadenopathy in the
paratracheal and subcarinal territories has increased. For example
an enlarged 1.6 cm right paratracheal node (201/15) is increased
from 0.6 cm. A 1.9 cm subcarinal node (201/31) is increased from
cm. Mild-to-moderate bilateral hilar lymphadenopathy is increased
bilaterally. Coarsely calcified nodes from prior granulomatous
disease are again noted in the left hilum.

Lungs/Pleura: No pneumothorax. New moderate right pleural effusion
with mild posterior right pleural thickening up to 7 mm thickness.
No left pleural effusion. There is new peribronchovascular
interstitial thickening and nodularity in the right greater than
left lungs. A subpleural 1.0 cm anterior left lower lobe pulmonary
nodule (203/35) is increased from 0.6 cm. There is moderate passive
atelectasis in the dependent right lung.

Musculoskeletal: No aggressive appearing focal osseous lesions.
Severe right glenohumeral joint osteoarthritis. Moderate
degenerative changes in the thoracic spine. There is anasarca.

CT ABDOMEN PELVIS FINDINGS

Hepatobiliary: Normal liver with no liver mass. Normal gallbladder
with no radiopaque cholelithiasis. No biliary ductal dilatation.

Pancreas: Normal, with no mass or duct dilation.

Spleen: Normal size. No mass.

Adrenals/Urinary Tract: Normal adrenals. Three subcentimeter
hypodense lesions in the right kidney are too small to characterize.
No hydronephrosis. Normal bladder.

Stomach/Bowel: Grossly normal stomach. Normal caliber small bowel
with no small bowel wall thickening. Appendix is not discretely
visualized. There is diffuse wall thickening of the entire colon and
rectum.

Vascular/Lymphatic: Atherosclerotic nonaneurysmal abdominal aorta.
Patent portal, splenic, hepatic and renal veins. There is new bulky
mesenteric lymphadenopathy, for example a 3.4 cm central mesenteric
node (201/79) . There is new bilateral mild inguinal
lymphadenopathy.

Reproductive: Stable moderate prostatomegaly.

Other: No pneumoperitoneum, ascites or focal fluid collection.

Musculoskeletal: No aggressive appearing focal osseous lesions.
Stable Paget's disease of the right proximal femur. Severe
degenerative changes in the lumbar spine. Anasarca.
IMPRESSION: 1. Interval progression of bilateral axillary, mediastinal,
bilateral hilar, mesenteric and bilateral inguinal lymphadenopathy,
in keeping with progressive lymphoma.
2. New peribronchovascular interstitial thickening and nodularity
involving the right greater than left lungs. Enlarging left lower
lobe subpleural pulmonary nodule. These findings likely represent
progressive pulmonary lymphoma.
3. New moderate right pleural effusion with mild pleural thickening,
suggesting a malignant effusion.
4. Anasarca. Diffuse colorectal wall thickening, likely due to non
inflammatory edema such as due to hypoalbuminemia given the
anasarca.
5. Atherosclerosis, including three-vessel coronary artery disease.
Please note that although the presence of coronary artery calcium
documents the presence of coronary artery disease, the severity of
this disease and any potential stenosis cannot be assessed on this
non-gated CT examination.
